# Patient Record
Sex: Male | Born: 1940
Health system: Southern US, Community
[De-identification: ages and names within clinical notes are randomized; demographics above are authoritative.]

## PROBLEM LIST (undated history)

## (undated) DIAGNOSIS — K5730 Diverticulosis of large intestine without perforation or abscess without bleeding: Secondary | ICD-10-CM

## (undated) DIAGNOSIS — Z9289 Personal history of other medical treatment: Secondary | ICD-10-CM

## (undated) DIAGNOSIS — D126 Benign neoplasm of colon, unspecified: Secondary | ICD-10-CM

## (undated) DIAGNOSIS — I4892 Unspecified atrial flutter: Secondary | ICD-10-CM

## (undated) DIAGNOSIS — Z950 Presence of cardiac pacemaker: Secondary | ICD-10-CM

## (undated) DIAGNOSIS — I2510 Atherosclerotic heart disease of native coronary artery without angina pectoris: Secondary | ICD-10-CM

## (undated) DIAGNOSIS — I639 Cerebral infarction, unspecified: Secondary | ICD-10-CM

## (undated) DIAGNOSIS — J45909 Unspecified asthma, uncomplicated: Secondary | ICD-10-CM

## (undated) HISTORY — DX: Unspecified asthma, uncomplicated: J45.909

## (undated) HISTORY — DX: Diverticulosis of large intestine without perforation or abscess without bleeding: K57.30

## (undated) HISTORY — DX: Personal history of other medical treatment: Z92.89

## (undated) HISTORY — DX: Benign neoplasm of colon, unspecified: D12.6

## (undated) HISTORY — PX: INGUINAL HERNIA REPAIR: SUR1180

## (undated) HISTORY — DX: Atherosclerotic heart disease of native coronary artery without angina pectoris: I25.10

## (undated) HISTORY — DX: Unspecified atrial flutter: I48.92

## (undated) HISTORY — DX: Cerebral infarction, unspecified: I63.9

---

## 1999-10-25 ENCOUNTER — Encounter: Admission: RE | Admit: 1999-10-25 | Discharge: 1999-10-25 | Payer: Self-pay | Admitting: Surgery

## 1999-10-25 ENCOUNTER — Encounter: Payer: Self-pay | Admitting: Surgery

## 2000-02-29 ENCOUNTER — Encounter: Payer: Self-pay | Admitting: Emergency Medicine

## 2000-02-29 ENCOUNTER — Inpatient Hospital Stay (HOSPITAL_COMMUNITY): Admission: EM | Admit: 2000-02-29 | Discharge: 2000-03-02 | Payer: Self-pay | Admitting: Emergency Medicine

## 2000-03-01 ENCOUNTER — Encounter: Payer: Self-pay | Admitting: Gastroenterology

## 2000-03-07 ENCOUNTER — Encounter: Admission: RE | Admit: 2000-03-07 | Discharge: 2000-03-07 | Payer: Self-pay

## 2002-06-10 ENCOUNTER — Ambulatory Visit (HOSPITAL_COMMUNITY): Admission: RE | Admit: 2002-06-10 | Discharge: 2002-06-10 | Payer: Self-pay | Admitting: Cardiology

## 2006-01-20 ENCOUNTER — Encounter (INDEPENDENT_AMBULATORY_CARE_PROVIDER_SITE_OTHER): Payer: Self-pay | Admitting: Cardiology

## 2006-01-20 ENCOUNTER — Ambulatory Visit (HOSPITAL_COMMUNITY): Admission: RE | Admit: 2006-01-20 | Discharge: 2006-01-20 | Payer: Self-pay | Admitting: Cardiology

## 2006-06-02 ENCOUNTER — Ambulatory Visit (HOSPITAL_BASED_OUTPATIENT_CLINIC_OR_DEPARTMENT_OTHER): Admission: RE | Admit: 2006-06-02 | Discharge: 2006-06-02 | Payer: Self-pay | Admitting: Surgery

## 2007-02-05 ENCOUNTER — Ambulatory Visit: Payer: Self-pay | Admitting: Internal Medicine

## 2007-09-09 ENCOUNTER — Encounter: Admission: RE | Admit: 2007-09-09 | Discharge: 2007-09-09 | Payer: Self-pay | Admitting: Gastroenterology

## 2007-09-09 ENCOUNTER — Inpatient Hospital Stay (HOSPITAL_COMMUNITY): Admission: AD | Admit: 2007-09-09 | Discharge: 2007-09-10 | Payer: Self-pay | Admitting: Gastroenterology

## 2007-09-11 ENCOUNTER — Ambulatory Visit (HOSPITAL_COMMUNITY): Admission: RE | Admit: 2007-09-11 | Discharge: 2007-09-11 | Payer: Self-pay | Admitting: Gastroenterology

## 2008-10-01 ENCOUNTER — Emergency Department (HOSPITAL_COMMUNITY): Admission: EM | Admit: 2008-10-01 | Discharge: 2008-10-01 | Payer: Self-pay | Admitting: Emergency Medicine

## 2008-12-05 HISTORY — PX: OTHER SURGICAL HISTORY: SHX169

## 2008-12-24 ENCOUNTER — Encounter: Admission: RE | Admit: 2008-12-24 | Discharge: 2008-12-24 | Payer: Self-pay | Admitting: Gastroenterology

## 2009-08-30 ENCOUNTER — Encounter: Admission: RE | Admit: 2009-08-30 | Discharge: 2009-08-30 | Payer: Self-pay | Admitting: Gastroenterology

## 2010-10-28 ENCOUNTER — Encounter: Payer: Self-pay | Admitting: Gastroenterology

## 2010-12-19 ENCOUNTER — Emergency Department (INDEPENDENT_AMBULATORY_CARE_PROVIDER_SITE_OTHER): Payer: Medicare Other

## 2010-12-19 ENCOUNTER — Emergency Department (HOSPITAL_BASED_OUTPATIENT_CLINIC_OR_DEPARTMENT_OTHER)
Admission: EM | Admit: 2010-12-19 | Discharge: 2010-12-19 | Disposition: A | Discharge status: Home / Self Care | Payer: Medicare Other | Attending: Emergency Medicine | Admitting: Emergency Medicine

## 2010-12-19 DIAGNOSIS — R059 Cough, unspecified: Secondary | ICD-10-CM

## 2010-12-19 DIAGNOSIS — Z79899 Other long term (current) drug therapy: Secondary | ICD-10-CM | POA: Insufficient documentation

## 2010-12-19 DIAGNOSIS — J40 Bronchitis, not specified as acute or chronic: Secondary | ICD-10-CM | POA: Insufficient documentation

## 2010-12-19 DIAGNOSIS — R05 Cough: Secondary | ICD-10-CM

## 2010-12-19 DIAGNOSIS — R509 Fever, unspecified: Secondary | ICD-10-CM

## 2010-12-19 DIAGNOSIS — R0989 Other specified symptoms and signs involving the circulatory and respiratory systems: Secondary | ICD-10-CM

## 2011-02-01 ENCOUNTER — Inpatient Hospital Stay (HOSPITAL_COMMUNITY)
Admission: EM | Admit: 2011-02-01 | Discharge: 2011-02-03 | DRG: 389 | Disposition: A | Discharge status: Home / Self Care | Payer: Medicare Other | Attending: Internal Medicine | Admitting: Internal Medicine

## 2011-02-01 DIAGNOSIS — Z903 Acquired absence of stomach [part of]: Secondary | ICD-10-CM

## 2011-02-01 DIAGNOSIS — K319 Disease of stomach and duodenum, unspecified: Secondary | ICD-10-CM | POA: Diagnosis present

## 2011-02-01 DIAGNOSIS — Z8711 Personal history of peptic ulcer disease: Secondary | ICD-10-CM

## 2011-02-01 DIAGNOSIS — E669 Obesity, unspecified: Secondary | ICD-10-CM | POA: Diagnosis present

## 2011-02-01 DIAGNOSIS — J309 Allergic rhinitis, unspecified: Secondary | ICD-10-CM | POA: Diagnosis present

## 2011-02-01 DIAGNOSIS — Z79899 Other long term (current) drug therapy: Secondary | ICD-10-CM

## 2011-02-01 DIAGNOSIS — K56609 Unspecified intestinal obstruction, unspecified as to partial versus complete obstruction: Principal | ICD-10-CM | POA: Diagnosis present

## 2011-02-01 DIAGNOSIS — I4892 Unspecified atrial flutter: Secondary | ICD-10-CM | POA: Diagnosis present

## 2011-02-02 ENCOUNTER — Inpatient Hospital Stay (HOSPITAL_COMMUNITY): Payer: Medicare Other

## 2011-02-02 ENCOUNTER — Emergency Department (HOSPITAL_COMMUNITY): Payer: Medicare Other

## 2011-02-02 LAB — COMPREHENSIVE METABOLIC PANEL
Albumin: 3.9 g/dL (ref 3.5–5.2)
BUN: 12 mg/dL (ref 6–23)
Calcium: 9.1 mg/dL (ref 8.4–10.5)
Creatinine, Ser: 0.51 mg/dL (ref 0.4–1.5)
GFR calc Af Amer: >60 mL/min (ref >60)
Glucose, Bld: 127 mg/dL — ABNORMAL HIGH (ref 70–99)
Sodium: 137 mEq/L (ref 135–145)
Total Protein: 3.5 g/dL — ABNORMAL LOW (ref 6.0–8.3)

## 2011-02-02 LAB — DIFFERENTIAL
Basophils Absolute: 0 10*3/uL (ref 0.0–0.1)
Basophils Absolute: 0 10*3/uL (ref 0.0–0.1)
Basophils Relative: 0 % (ref 0–1)
Eosinophils Absolute: 0.1 10*3/uL (ref 0.0–0.7)
Eosinophils Relative: 0 % (ref 0–5)
Lymphs Abs: 1.1 10*3/uL (ref 0.7–4.0)
Monocytes Absolute: 0.8 10*3/uL (ref 0.1–1.0)
Monocytes Relative: 6 % (ref 3–12)
Monocytes Relative: 8 % (ref 3–12)
Neutro Abs: 7.9 10*3/uL — ABNORMAL HIGH (ref 1.7–7.7)
Neutrophils Relative %: 80 % — ABNORMAL HIGH (ref 43–77)

## 2011-02-02 LAB — CBC
HCT: 45.8 % (ref 39.0–52.0)
Hemoglobin: 14.7 g/dL (ref 13.0–17.0)
MCH: 32.1 pg (ref 26.0–34.0)
MCHC: 34.4 g/dL (ref 30.0–36.0)
MCV: 91.8 fL (ref 78.0–100.0)
MCV: 93.2 fL (ref 78.0–100.0)
Platelets: 170 10*3/uL (ref 150–400)
RDW: 13.5 % (ref 11.5–15.5)
RDW: 13.6 % (ref 11.5–15.5)
WBC: 11 10*3/uL — ABNORMAL HIGH (ref 4.0–10.5)

## 2011-02-02 LAB — URINALYSIS, ROUTINE W REFLEX MICROSCOPIC
Glucose, UA: NEGATIVE mg/dL
Hgb urine dipstick: NEGATIVE
Nitrite: NEGATIVE

## 2011-02-02 MED ORDER — IOHEXOL 300 MG/ML  SOLN
125.0000 mL | Freq: Once | INTRAMUSCULAR | Status: AC | PRN
  Administered 2011-02-02: 125 mL via INTRAVENOUS

## 2011-02-03 ENCOUNTER — Inpatient Hospital Stay (HOSPITAL_COMMUNITY): Payer: Medicare Other

## 2011-02-03 LAB — CBC
HCT: 41 % (ref 39.0–52.0)
Hemoglobin: 13.8 g/dL (ref 13.0–17.0)
MCH: 31.4 pg (ref 26.0–34.0)
MCHC: 33.7 g/dL (ref 30.0–36.0)
MCV: 93.4 fL (ref 78.0–100.0)

## 2011-02-03 LAB — DIFFERENTIAL
Eosinophils Relative: 4 % (ref 0–5)
Neutro Abs: 4.6 10*3/uL (ref 1.7–7.7)
Neutrophils Relative %: 67 % (ref 43–77)

## 2011-02-03 LAB — COMPREHENSIVE METABOLIC PANEL
ALT: 13 U/L (ref 0–53)
Albumin: 3.1 g/dL — ABNORMAL LOW (ref 3.5–5.2)
BUN: 9 mg/dL (ref 6–23)
CO2: 28 mEq/L (ref 19–32)
Creatinine, Ser: 0.81 mg/dL (ref 0.4–1.5)
GFR calc Af Amer: >60 mL/min (ref >60)
GFR calc non Af Amer: >60 mL/min (ref >60)
Glucose, Bld: 95 mg/dL (ref 70–99)
Sodium: 136 mEq/L (ref 135–145)

## 2011-02-03 NOTE — Consult Note (Signed)
NAME:  Duane Mercado, Duane Mercado NO.:  [id number]  MEDICAL RECORD NO.:  [id number]           PATIENT TYPE:  I  LOCATION:  1540                         FACILITY:  [facility]  PHYSICIAN:  Juanetta Gosling, MDDATE OF BIRTH:  10/31/1940  DATE OF CONSULTATION:  02/01/2011 DATE OF DISCHARGE:                                CONSULTATION   CHIEF COMPLAINT:  Abdominal pain.  HISTORY OF PRESENT ILLNESS:  [name] with less than 24 hours of abdominal bloating and some periumbilical pain.  He did pass a small amount of flatus overnight and did have a bowel movement last night.  He came to the emergency room due to persistence of the pain. He has a history of prior small bowel obstructions that all have resolved conservatively, many of these he thinks have occurred at home and he just waited them out at home.  His last admission was in end of 2008 which was managed nonoperatively.  This has gotten better since he is admitted, he does not really have any significant complaints at this time and has gotten some relief by not eating and IV fluid as well some of the pain medications he was served in the emergency room.  PAST MEDICAL HISTORY: 1. Significant for atrial flutter for which she has had cardioversion     in the the past. 2. Seasonal allergies. 3. History of all multiple small bowel obstructions.  PAST SURGICAL HISTORY: 1. He had a partial gastrectomy for perforated ulcer about 21 years     ago that, I think, was repaired with a Billroth I anastomosis 2. Right inguinal hernia. 3. Left inguinal hernia. 4. Tonsillectomy.  MEDICATIONS:  His medications are as Tambocor and he tells he takes several of his other vitamins.  He has previously been on Coumadin but has been off the Coumadin for about 3 years at this point.  SOCIAL HISTORY:  He works as a Education officer, community.  He has occasional alcohol use. Does not smoke.  ALLERGIES:  None known.  PHYSICAL EXAMINATION:   VITAL SIGNS:  His physical examination today shows him to be temperature 98.5, pulse 54, blood pressure 137/82, respirations 18 and pulse oximetry 95% on room air. GENERAL:  He is a well-appearing male, in no distress. HEENT:  He has no scleral icterus. HEART:  Regular rate and rhythm. LUNGS:  Clear bilaterally. ABDOMEN:  His abdomen has a well-healed midline scar.  His abdomen is fairly soft.  He does have bowel sounds that are present.  He is not really distended at all and he is really only mildly tender around his umbilicus.  LABORATORY DATA:  His laboratory evaluation shows a white blood cell count of 9.8 with 80 segs, platelets of 160, hematocrit 42.7. Urinalysis is negative.  His compressive metabolic profile was significant only for bilirubin of 1.6, total protein of 3.5, and a glucose of 127.  An acute  abdominal series that was completed early on February 02, 2011, shows dilated small bowel loops.  A CT scan shows a partial small bowel obstruction near the umbilicus, diverticulosis and cholelithiasis.  He has some  minimal pelvic ascites.  ASSESSMENT:  Small-bowel obstruction, partial.  PLAN:  I think he most likely has a small-bowel obstruction secondary to adhesive disease.  He is in no need of an urgent operation.  I think we will plan on just following him conservatively, n.p.o., recheck a KUB in the morning to see if his contrast is moving through.  I do not think he needs an NG tube right now at all either but if he becomes nauseated, vomiting or is at all distended, he will need to have an NG tube placed. I discussed this plan with him and he is amenable to this.     Juanetta Gosling, MD     MCW/MEDQ  D:  02/02/2011  T:  02/02/2011  Job:  [id number]  cc:   Danise Edge, M.D. Fax: [phone]  Electronically Signed by Emelia Loron MD on 02/03/2011 08:07:29 PM

## 2011-02-13 NOTE — Discharge Summary (Signed)
NAME:  Duane Mercado, Duane Mercado NO.:  [id number]  MEDICAL RECORD NO.:  [id number]           PATIENT TYPE:  I  LOCATION:  1540                         FACILITY:  [facility]  PHYSICIAN:  Ramiro Harvest, MD    DATE OF BIRTH:  [birth year]  DATE OF ADMISSION:  02/01/2011 DATE OF DISCHARGE:  02/03/2011                        DISCHARGE SUMMARY - REFERRING   PRIMARY CARE PHYSICIAN:  Dr. Danise Edge.  DISCHARGE DIAGNOSES: 1. Small bowel obstruction, resolved. 2. Abdominal pain secondary to problem #1. 3. Atrial flutter. 4. History of peptic ulcer disease. 5. History of recurrent small bowel obstructions secondary to     adhesions. 6. Status post partial gastrectomy secondary to a perforated ulcer     about 21 years ago. 7. Right inguinal hernia repair. 8. Status post left inguinal hernia repair. 9. Status post tonsillectomy. 10.History of seasonal allergies.  DISCHARGE MEDICATIONS: 1. Coenzyme Q10 100 mg 2 tablets p.o. daily. 2. Fish oil 1000 mg p.o. daily. 3. Flecainide 50 mg p.o. b.i.d. 4. Magnesium oxide 500 mg p.o. daily. 5. Multivitamins 1 tablet p.o. daily. 6. Vitamin C 500 mg 2 tablets p.o. daily. 7. Vitamin D3, 1000 units 1 tablet p.o. daily.  DISPOSITION AND FOLLOWUP:  The patient will be discharged home on clear liquids for the next 24 hours and then advance as tolerated to a bland diet.  The patient is to call to schedule a followup appointment with his PCP 1 week post discharge.  CONSULTATIONS DONE:  A surgical consultation was done.  The patient was seen in consultation by Dr. Emelia Loron of [facility] on February 01, 2011.  PROCEDURES PERFORMED: 1. An acute abdominal series was done February 02, 2011 that showed a     small bowel obstruction.  No free intraperitoneal air.  No acute     cardiopulmonary disease. 2. A CT of the abdomen and pelvis was done, February 02, 2011 that showed     partial small bowel obstruction due to an adhesion in the  midabdomen at the approximate level of the umbilicus.  No free     intraperitoneal air, sigmoid colon diverticulosis without evidence     of acute diverticulitis, minimal pelvic ascites, cholelithiasis     without CT evidence of acute cholecystitis, moderate median lobe     prostate gland enlargement.  BRIEF ADMISSION HISTORY AND PHYSICAL:  [name] is a 70 year old Caucasian gentleman who presented to the ED with complaints of severe left-sided abdominal pain, which started in the afternoon on the day of admission.  The patient reported having a history of small bowel obstruction secondary to abdominal surgeries that he had in the past. The patient stated that he tried to relieve the discomfort by not eating.  The abdominal pain continued, so he called the gastroenterologist on call for Dr. Laural Benes, Dr. Bosie Clos who advised him to report to the ED for evaluation.  The patient denied having any nausea or vomiting.  He also reported having 2 bowel movements, one in the morning and one following the discomfort.  The patient stated at times, the discomfort will usually resolve in a few hours,  but this time, did not resolve and thus presented to the ED.  For the rest of admission history and physical, please see H and P dictated per Dr. Lovell Sheehan of job [phone].  HOSPITAL COURSE:  Small bowel obstruction.  The patient was admitted with small bowel obstruction.  The patient was placed on bowel rest and placed on supportive care with antiemetics and pain management.  He was monitored and followed and placed on IV fluids.  A surgical consultation was subsequently obtained.  The patient was seen in consultation by Dr. Emelia Loron of General Surgery and at that time, recommended to continue bowel rest and to treat conservatively.  The patient did not need an NG tube as he remained stable and was not vomiting and the stomach was not distended.  The patient was monitored and followed.   The patient improved clinically.  During the hospitalization, abdominal pain improved significantly.  Did not have any nausea, did not have any emesis.  The patient was passing gas and also had bowel movements as well.  The patient was subsequently placed on a clear liquid diet, which the patient tolerated well.  The patient did not have any further abdominal pain.  The patient will be discharged home on 24 hours of clear liquids and advance as tolerated to a bland liquids and bland diet.  The patient will be discharged home in stable and improved condition with close followup with his PCP as stated above.  On day of discharge, vital signs, temperature 98.3, pulse of 51, respirations 20, blood pressure 127/63, and saturating 94% on room air.  Discharge labs, sodium 136, potassium 3.8, chloride 104, bicarbonate 28, glucose 95, BUN 9, creatinine 0.81, bilirubin of 1.6, alkaline phosphatase 63, AST 14, ALT 13, protein 5.5, albumin 3.1, and calcium of 8.2.  CBC with a white count of 6.9, hemoglobin 13.8, hematocrit 41.0, platelet count of 162, and ANC of 4.6.  It has been a pleasure taking care of [name].     Ramiro Harvest, MD     DT/MEDQ  D:  02/03/2011  T:  02/03/2011  Job:  [id number]  cc:   Danise Edge, M.D. Fax: [phone]  Juanetta Gosling, MD [address] [place] [address]  Electronically Signed by Ramiro Harvest MD on 02/13/2011 02:51:53 PM

## 2011-02-16 NOTE — H&P (Signed)
NAME:  Duane Mercado, TAN NO.:  [id number]  MEDICAL RECORD NO.:  [id number]           PATIENT TYPE:  I  LOCATION:  1540                         [facility]ACILITY:  [facility]  PHYSICIAN:  Della Goo, M.D. DATE OF BIRTH:  May 15, 1941  DATE OF ADMISSION:  02/01/2011 DATE OF DISCHARGE:                             HISTORY & PHYSICAL   PRIMARY CARE PHYSICIAN:  Danise Edge, M.D.  CHIEF COMPLAINT:  Abdominal pain.  HISTORY OF PRESENT ILLNESS:  [name] who was brought to the emergency department with complaints of severe left-sided abdominal pain, which started in the afternoon.  The patient reports having a history of small bowel obstructions secondary to abdominal surgeries that he has had in the past.  He states that he tried to relieve the discomfort by not eating and the abdominal pain continued, so he called the gastroenterologist on-call for Dr. Laural Benes and Dr. Cheryl Flash advised him to report to the emergency department for evaluation.  The patient denied having any nausea or vomiting.  He also reports having 2 bowel movements, 1 in the a.m. and 1 following the discomfort.  He states that at times this discomfort will usually resolve in a few hours, but at this time the discomfort did not resolve.  PAST MEDICAL HISTORY:  Significant for: 1. Partial gastrectomy secondary to a perforated ulcer. 2. Atrial flutter, on flecainide therapy. 3. Peptic ulcer disease. 4. History of recurrent small bowel obstruction secondary to     adhesions.  MEDICATIONS:  Flecainide 50 mg 1 p.o. b.i.d.  ALLERGIES:  ASPIRIN and IBUPROFEN.  SOCIAL HISTORY:  The patient is a Education officer, community in the area.  He is a nonsmoker.  He reports alcohol usage of 3-4 times a week and denies any illicit drug usage.  FAMILY HISTORY:  Positive for father dying of melanoma, but otherwise noncontributory.  REVIEW OF SYSTEMS:  Pertinents mentioned above.  PHYSICAL EXAMINATION:  GENERAL:  This  is a 70 year old obese Caucasian male who is in discomfort, but no acute distress. VITAL SIGNS:  Temperature 97.9, blood pressure 187/92, heart rate 67, respiratory rate 20, O2 saturations 95%. HEENT:  Normocephalic, atraumatic.  Pupils equally round, reactive to light.  Extraocular movements are intact.  Funduscopic benign.  Nares are patent bilaterally.  Oropharynx clear. NECK:  Supple.  Full range of motion.  No thyromegaly, adenopathy or jugular venous distention. CARDIOVASCULAR:  Regular rate and rhythm.  No murmurs, gallops or rubs appreciated. LUNGS:  Clear to auscultation bilaterally.  Normal lung field excursion bilaterally.  Breathing is unlabored. ABDOMEN:  Positive bowel sounds, which are mildly hypoactive.  Abdomen is distended and mildly tender on the left side.  There is no rebound. No guarding. EXTREMITIES:  Without cyanosis, clubbing or edema. NEUROLOGIC:  Nonfocal.  LABORATORY STUDIES:  White blood cell count 11.0, hemoglobin 16.0, hematocrit 45.8, platelets 170, neutrophils 88%, lymphocytes 6%.  Sodium 137, potassium 4.1, chloride 104, CO2 of 24, BUN 12, creatinine 0.51, glucose 127.  Albumin 3.9, AST 22, ALT 16.  Urinalysis is negative. Acute abdominal series revealed clear lung fields and evidence of a small bowel obstruction.  No free air seen.  ASSESSMENT:  [name] being admitted with: 1. Small bowel obstruction. 2. Abdominal pain on the left side secondary to small bowel     obstruction. 3. Atrial flutter, on flecainide therapy and stable at this time. 4. Peptic ulcer disease. 5. Obesity.  PLAN:  The patient will be admitted.  He will be made n.p.o. except for ice chips at this time.  Pain control therapy has been ordered as needed and antiemetic therapy has also been ordered as well.  His regular medications will be further verified and the patient will be placed on DVT prophylaxis.  Further consultations will be considered by  the primary team if needed.  General surgery and/or gastroenterology.  The patient is a full code.     Della Goo, M.D.     HJ/MEDQ  D:  02/02/2011  T:  02/02/2011  Job:  [id number]  cc:   Danise Edge, M.D. Fax: [phone]  Electronically Signed by Della Goo M.D. on 02/16/2011 07:47:31 PM

## 2011-02-19 NOTE — Consult Note (Signed)
NAME:  Duane Mercado, COOR                 ACCOUNT NO.:  [id number]   MEDICAL RECORD NO.:  [id number]          PATIENT TYPE:  INP   LOCATION:  5703                         FACILITY:  [facility]   PHYSICIAN:  Dorisann Frames, M.D.   DATE OF BIRTH:  [birth year]   DATE OF CONSULTATION:  DATE OF DISCHARGE:                                 CONSULTATION   REFERRING PHYSICIAN:  Dr. Laural Benes.   CARDIOLOGIST:  Dr. Amil Amen.   CONSULTING SURGEON:  Dr. Talmage Nap.   REASON FOR CONSULT:  Partial small bowel obstruction.   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 Laural Benes with a  partial small bowel obstruction.  He has a history of atrial flutter, as  well as asthma.  The patient states that he had a partial gastrectomy  done about 20 years ago, and since then he has had several episodes of  partial small bowel obstructions which have resolved on their own,  except for 1 episode in 2001 which he says he was admitted to [facility] for;  however, I do not see any record of that admission.  He has also had  right- and left-sided inguinal hernia repair.  One was done in 1997, and  the other was done in 2007 by Dr. Daphine Deutscher.   The patient states that last night he ate a lot of roughage, and when he  woke up around 2 a.m. this morning, he was complaining of pain that he  rates a 7 or 8 on a scale out of 10.  At this time, he tried to go to  the restroom and had a large bowel movement.  However, he feels that  this was just residual stool that was in his colon.  He also self-  induced vomiting because he felt full.   At this time, he is not, and has never been, nauseated.  He denies  having any blood in his stool or his emesis.  Currently, the patient is  not having much pain.  He did have abdominal x-rays done today at  [facility] which were ordered by Dr. Laural Benes, which show a  partial small bowel obstruction.  At this time, we are  consulted to help  manage this issue.   REVIEW OF SYSTEMS:  Negative.  See HPI.   PAST MEDICAL HISTORY:  1. Atrial flutter, which he has had cardioversion for in the past.  2. Asthma, which they attribute to seasonal rhinitis.  3. Partial small bowel obstructions over the past 20 years on and off.      A partial small bowel obstruction in 2001 required admission;      however, apparently the abdominal CT scan at that time was okay.   PAST SURGICAL HISTORY:  1. He had a partial gastrectomy for a perforated ulcer about 20 years      ago.  2. He also had a right inguinal hernia repair in 1997.  3. He had a tonsillectomy (I am unaware of this date).  4. He  also had a left inguinal hernia repair done in 2007 by Dr.      Daphine Deutscher.   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  He states  that he has never used tobacco products.  He does drink in moderation  some wine but does not drink any hard liquor and denies any use of  illegal drugs.   ALLERGIES:  NO KNOWN DRUG ALLERGIES.   MEDICATIONS:  1. Coumadin 5 mg Tuesday through Sunday and 2.5 mg on Monday.  2. Tambocor 50 mg b.i.d.  3. He also takes other various vitamins (vitamin C, D, and E), as well      as calcium and omega-3/glucosamine.  4. Since being admitted, he has been ordered IV Dilaudid p.r.n. for      pain, as well as Ambien and Protonix.   PHYSICAL EXAMINATION:  GENERAL:  This is a pleasant, well-developed,  well-nourished 70 year old white male who is in no acute distress lying  in bed.  VITAL SIGNS:  Temperature is 97.5, blood pressure is 136/82, and weight  is 238 pounds.  HEENT:  Head is normocephalic, atraumatic.  The sclerae are noninjected.  NECK:  Supple, with no lymphadenopathy, no thyromegaly.  Trachea is  midline.  CHEST:  Clear to auscultation bilaterally.  No wheezes, rhonchi, or  rales heard.  HEART:  Regular rate and rhythm, with no murmurs, gallops, or rubs  noted.  No obvious JVD.   ABDOMEN:  Soft and tender in the left periumbilical region.  Otherwise,  he is nontender.  He does have positive bowel sounds and is slightly  distended.  A small mass is noted in the left periumbilical region,  which he states has been there for a long time and he thinks is a  lipoma.  No organomegaly.  EXTREMITIES:  He is moving all 4 extremities.  Pulses are palpable in  all 4 extremities.  No cyanosis, clubbing, or edema noted.  NEUROLOGIC:  He is alert and oriented x 3, moving all 4 extremities,  with no focal deficits.   LABS AND DIAGNOSTICS:  White blood cell count is 8700, hemoglobin is  15.2, and neutrophils are 80%.  Abdominal x-ray done today shows a  partial small bowel obstruction, with no free air.  Otherwise, they are  pending at this time.   IMPRESSION:  1. Partial small bowel obstruction.  2. Atrial flutter, which is controlled with medication.   PLAN:  At this time, we agree with IV hydration via IV fluids.  We also  agree with keeping him n.p.o. at this time, and we will follow any  additional labs.  We will also try and avoid having to use a nasogastric  tube.  At this time, the patient is not having any nausea or vomiting,  and so this would allow [place] to keep the NG tube from being used.  However, if the patient becomes nauseated and begins to throw up and his  belly becomes distended, we may not be able to avoid this, and we will  probably have to use an NG tube.  Hopefully, this can be avoided.  In  the morning, we will also get 2-view abdominal x-rays to follow the  progression of his obstruction.  At this time, we do not feel like  surgical intervention is necessary, and we will continue with medical  management at this time.  Any further recommendations will be per Dr.  Talmage Nap after his examination.  Letha Cape, PA    ______________________________  Dorisann Frames, M.D.   KEO/MEDQ  D:  09/09/2007  T:  09/10/2007  Job:  [id number]

## 2011-02-19 NOTE — H&P (Signed)
NAME:  Duane Mercado, Duane Mercado NO.:  [id number]   MEDICAL RECORD NO.:  [id number]          PATIENT TYPE:  INP   LOCATION:  5703                         FACILITY:  [facility]   PHYSICIAN:  Danise Edge, M.D.   DATE OF BIRTH:  Aug 15, 1941   DATE OF ADMISSION:  09/09/2007  DATE OF DISCHARGE:                              HISTORY & PHYSICAL   ADMISSION DIAGNOSIS:  Small bowel obstruction.   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  He had a normal soft bowel movement without bleeding at 3:00 a.m. today.  He induced nonbloody liquid vomitus early this morning.   In 1991 he underwent a partial gastrectomy, performed for a perforated  gastric ulcer.   In 1997 he underwent a right inguinal hernia repair.  In 2007 he  underwent a left inguinal hernia repair.   In 2001 Dr. Stoney Bang was hospitalized with a partial small bowel  obstruction probably secondary to adhesions which resolved  spontaneously.  A CT scan of the abdomen was normal.   Dr. Jenetta Loges acute abdominal x-ray series performed at [facility]  Imaging this morning was compatible with a partial small-bowel  obstruction.   MEDICATIONS ALLERGIES:  None.   CHRONIC MEDICATIONS:  Coumadin 5 mg on Tuesday through Sunday; 2.5 mg on  Monday. Flecainide 50 mg twice daily.   PAST MEDICAL AND SURGICAL HISTORY:  Atrial flutter requiring  cardioversion.  Bilateral inguinal hernia repairs. Asthma with seasonal  rhinitis. Partial gastrectomy for perforated ulcer in 1991.  Tonsillectomy. Hospitalization in 2001 for small-bowel obstruction,  which resolved spontaneously.   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  He consumes alcohol  in moderation.   FAMILY HISTORY:  Father died of melanoma.  Family history is otherwise  noncontributory.   PHYSICAL EXAMINATION:  VITAL SIGNS:  Weight 238 pounds, temperature  97.5,  blood pressure 136/82.  GENERAL APPEARANCE:  Dr. Stoney Bang is alert and ambulatory.  On admission  his abdomen was moderately distended and slightly uncomfortable with  palpation.  HEENT  Nonicteric sclera.  Normal oropharynx.  No cervical adenopathy.  LUNGS:  Clear to auscultation.  CARDIAC:  Exam reveals a regular rhythm without murmurs.  ABDOMEN:  Moderately distended and hyperresonant to percussion.  Hypoactive bowel sounds were high-pitched and tinkling.  There is  discomfort to deep palpation in the right and left periumbilical areas.  SKIN:  Warm and dry.   ASSESSMENT:  Recurrent partial small-bowel obstruction.   PLAN:  Keep NPO, IV fluids.  I think I can avoid placing a nasogastric  tube.  Consult Dr. Wenda Low.  I did speak with Dr. Amil Amen, and he  says it is okay to remain off flecainide and Coumadin for now.           ______________________________  Danise Edge, M.D.     MJ/MEDQ  D:  09/09/2007  T:  09/10/2007  Job:  [id number]   cc:   Francisca December, M.D.  [place] Daphine Deutscher, MD  Casimiro Needle  Denice Paradise, MD, FCCP

## 2011-02-22 NOTE — Assessment & Plan Note (Signed)
[facility]                             PULMONARY OFFICE NOTE   ROXY, FILLER                          MRN:          [id number]  DATE:02/05/2007                            DOB:          November 09, 1940    HISTORY:  This is a 70 year old white male never smoker who was  diagnosed with asthma by Dr. Andria Meuse several years ago after he fell  off of his boat and swallowed some water. The patient was given a short  course of prednisone and then a prolonged course of Advair.   Once he stopped the Advair, he had no symptoms and has been off of it  for several years with no difficulty. He does have occasional nasal  congestion in the spring, greater than the fall, mostly consistent with  stuffy nose, but minimal runny eyes and nose, for which he uses Zyrtec  with benefit. He is able to work out at [employer] without any  difficulty and denies any subjective or dyspnea, although he admits that  he is not intensely aerobically active.   PAST MEDICAL HISTORY:  Significant for atrial fibrillation for which he  has undergone defibrillation in the past and partial gastrectomy.   ALLERGIES:  None known.   MEDICATIONS:  1. Coumadin.  2. Tambocor.   SOCIAL HISTORY:  He has never smoked. He is an active dentist.   FAMILY HISTORY:  Positive for possible asthma in his son.   REVIEW OF SYSTEMS:  Taken in detail on the worksheet. Significant for  the absence of chronic cough or any significant limitations in terms of  activity.   PHYSICAL EXAMINATION:  GENERAL:  This is a pleasant, ambulatory, and  healthy appearing white male in no acute distress.  VITAL SIGNS:  Stable.  HEENT:  Reveals mild turbinate edema with non-specific features. Oral  pharynx is clear.  NECK:  Supple without cervical adenopathy or tenderness. Trachea is  midline.  LUNGS:  Fields are perfectly bilaterally to auscultation and percussion.  CARDIAC:  Regular rate and rhythm without murmur,  gallop, or rub.  ABDOMEN:  Benign and soft.  EXTREMITIES:  Warm without calf tenderness, cyanosis, clubbing, or  edema.   IMPRESSION:  The only respiratory problem I see is that the patient  appears to have seasonal rhinitis with features that are well controlled  with p.r.n. Zyrtec. Certainly he can use nasal saline as well and we  could use nasal steroids if his symptoms become more severe.   The main issue here is that the punishment needs to fit the crime in  terms of fitting his symptomatology, which is minimum, against any  medicine that he needs to take. For now, I see no need for even p.r.n.  medications, but would like to document a set of PFTs for future  reference.     Charlaine Dalton. Sherene Sires, MD, [facility]  Electronically Signed    MBW/MedQ  DD: 02/05/2007  DT: 02/06/2007  Job #: [id number]   cc:   Danise Edge, M.D.

## 2011-02-22 NOTE — Op Note (Signed)
NAME:  LOTTIE, SISKA NO.:  [id number]   MEDICAL RECORD NO.:  [id number]          PATIENT TYPE:  AMB   LOCATION:  ENDO                         FACILITY:  [facility]   PHYSICIAN:  Francisca December, M.D.  DATE OF BIRTH:  1941-08-24   DATE OF PROCEDURE:  01/20/2006  DATE OF DISCHARGE:                                 OPERATIVE REPORT   PROCEDURE PERFORMED:  1.  Direct current elective cardioversion.  2.  Above following a clearing TEE.   INDICATIONS:  Dr. Assunta Found is 70 year old man with recurrent atrial  flutter.  He has been minimally symptomatic with this.  He has been  anticoagulated for about the past 5 days and has been placed onto p.o.  Flecainide. He is to undergo a direct current cardioversion at this time. I  have completed a TEE which has documented the absence of left atrial  appendage thrombus.   DESCRIPTION OF PROCEDURE:  The patient underwent direct current  cardioversion while monitoring heart rate, blood pressure, O2 saturation and  ECG in the presence of Dr. Adonis Huguenin who administered 200 mg of IV  Pentothal.  After establishing deep anesthesia, the patient received a  single transthoracic dose of 100 joules synchronized biphasic energy.  There  was prompt return of sinus rhythm.   IMPRESSION:  Successful elective cardioversion atrial flutter to sinus  rhythm.   PLAN:  1.  Continue warfarin and Flecainide. Follow-up office visit in 1-2 weeks.  2.  Of note, there was no significant structural heart disease, except mild      left atrial enlargement on his TEE.      Francisca December, M.D.  Electronically Signed     JHE/MEDQ  D:  01/20/2006  T:  01/20/2006  Job:  [id number]

## 2011-02-22 NOTE — Op Note (Signed)
NAME:  Duane Mercado, Duane Mercado                 ACCOUNT NO.:  [id number]   MEDICAL RECORD NO.:  [id number]          PATIENT TYPE:  AMB   LOCATION:  DSC                          FACILITY:  [facility]   PHYSICIAN:  Thornton Park. Daphine Deutscher, MD  DATE OF BIRTH:  02/16/1941   DATE OF PROCEDURE:  06/02/2006  DATE OF DISCHARGE:                                 OPERATIVE REPORT   PREOPERATIVE DIAGNOSIS:  Left inguinal hernia.   POSTOPERATIVE DIAGNOSIS:  Left indirect inguinal hernia.   PROCEDURE:  Left inguinal herniorrhaphy with mesh.   SURGEONS:  Thornton Park. Daphine Deutscher, MD   ANESTHESIA:  General by LMA.   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  The inguinal  region was clipped and then prepped with Betadine widely and draped  sterilely.  A small oblique incision was made and carried down through fatty  tissue of the external oblique, which was identified.  The external oblique  muscles were then divided along the fibers of the external ring.  The cord  was mobilized and elevated and the internal ring was noted.  I found a very  prominent sac, which I easily dissected free from the cord structures and  opened it.  With my finger inside, I found some fatty tissue herniating down  into the canal, but I tucked that back up inside and then closed it with a  pursestring suture of 2-0 silk under direct vision and excised the excess  sac.  With this tucked up inside, I then put 2 sutures to tack the internal  ring placed medially and then I repaired the entire floor and the ring with  a piece of mesh that I cut to fit and sutured along the inguinal ligament  with a running 2-0 Prolene below and above.  I did it and then did not like  and then redid it and got a good overage of this floor with a running 2-0  Prolene.  The 2 pieces were overlapped at the exit of the cord structures  and held in place with a single horizontal  mattress suture of 2-0 Prolene.  This was then tucked beneath the external oblique.  The ilioinguinal nerve  branches were not obvious, but I did not appear to incorporate these in any  sutures and the external oblique was closed with a 2-0 Vicryl and the area  was irrigated well and closed with subcutaneous sutures of 4-0 Vicryl in a  running subcuticular 5-0 Vicryl with Benzoin and Steri-Strips.  The patient  seemed to tolerate the procedure well and was taken to the recovery room in  satisfactory condition.  He will be given some Tylox to take if needed for  pain and will followed up in the office in 2-3 weeks.      Thornton Park Daphine Deutscher, MD  Electronically Signed     MBM/MEDQ  D:  06/02/2006  T:  06/03/2006  Job:  [id number]

## 2011-02-22 NOTE — Op Note (Signed)
   NAME:  MARKEEM, NOREEN NO.:  [id number]   MEDICAL RECORD NO.:  [id number]                   PATIENT TYPE:  OIB   LOCATION:  2870                                 FACILITY:  [facility]   PHYSICIAN:  Francisca December, M.D.               DATE OF BIRTH:  10/11/1940   DATE OF PROCEDURE:  06/10/2002  DATE OF DISCHARGE:                                 OPERATIVE REPORT   PROCEDURE PERFORMED:  Elective cardioversion   INDICATIONS FOR PROCEDURE:  The patient is a 70 year old man with the onset  of atrial flutter and 4:1 block noted during examination last week.  He has  been complaining of worsening fatigue in reduced exertional times.  He has  been placed on Warfarin for the past three days.  His INR today is 1.2 with  prothrombin time of 15.2.  He has a structurally normal heart by  transthoracic echocardiogram.  A transesophageal echocardiogram has  confirmed the absence of left atrial or left atrial appendage thrombus.  He  has normal left ventricular systolic function and normal valvular  morphology.  No significant Doppler abnormality.  He has a left atrial  appendage ejection velocity of 60 cm per second.  He is to undergo  transthoracic cardioversion at this time.   DESCRIPTION OF PROCEDURE:  Under the supervision of  Dr. Fayrene Fearing D. Krista Blue,  anesthesiology, the patient was administered 200 mg of IV Pentothal.  He had  already received 6 mg of midazolam and 100 mcg of Fentanyl for his TEE.  While monitoring heart rate, blood pressure, O2 saturation and ECG and after  establishing deep anesthesia, the patient underwent a transthoracic dose of  100 joules synchronized direct current energy.  This resulted in prompt  return of sinus rhythm with frequent PACS.   Will administer 10 mg of IV metoprolol for suppression of PACS at this time  and begin p.o. Toprol 25 mg p.o. once daily.   PLAN:  1. Begin beta blocker as above.  2. Will give subcutaneous dose of  Lovenox now 1 mg per kg.  3. Increase Coumadin to 10 mg p.o. daily for the next two days and then     return to 5 mg daily.  4. Repeat prothrombin time in three to four days.  5. I will see him in the office in two to three weeks.                                               Francisca December, M.D.    JHE/MEDQ  D:  06/10/2002  T:  06/11/2002  Job:  [id number]   cc:   Gracelyn Nurse, M.D.

## 2011-02-22 NOTE — Discharge Summary (Signed)
NAME:  Duane Mercado, Duane Mercado NO.:  [id number]   MEDICAL RECORD NO.:  [id number]          PATIENT TYPE:  INP   LOCATION:  5703                         FACILITY:  [facility]   PHYSICIAN:  Danise Edge, M.D.   DATE OF BIRTH:  [birth year]   DATE OF ADMISSION:  09/09/2007  DATE OF DISCHARGE:  09/10/2007                               DISCHARGE SUMMARY   DISCHARGE DIAGNOSES:  1. Partial small-bowel obstruction, resolved.  2. Remote history of atrial flutter, cardioverted.  3. Chronic Coumadin therapy.  4. Left inguinal herniorrhaphy.  5. Right inguinal herniorrhaphy.  6. Asthma with seasonal rhinitis.  7. Remote Billroth I gastrectomy for perforated ulcer.  8. Tonsillectomy.  9. In 2001 CT, scan of the abdomen and pelvis was consistent with      small bowel obstruction secondary to adhesions.   DISCHARGE MEDICATIONS:  1. Coumadin 5 mg on Tuesday through Sunday; 2.5 mg on Monday.  2. Flecainide 50 mg twice daily.   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  Prior to being admitted to the hospital, he had a normal  soft bowel movement unassociated with bleeding.  He induced nonbloody  liquid vomitus prior to admission.   Admission acute abdominal x-ray series performed at [facility]  was compatible with partial small-bowel obstruction.   On admission Dr. Jenetta Loges white blood cell count was 8700, hemoglobin  15.2 g, with a normal platelet count.  His prothrombin time/INR was 1.7  on Coumadin.  His complete metabolic profile was normal.  His vitamin  B12 level was normal.   Dr. Stoney Bang was admitted n.p.o. on intravenous fluids.  A nasogastric  tube was not placed.  Within 24 hours his abdominal distention and  abdominal pain resolved.  His discharge acute abdominal x-ray series was  consistent with an improving partial small bowel obstruction pattern.   Dr. Stoney Bang was discharged in stable medical condition on September 10, 2007.  He underwent a CT enterography as an outpatient, which did not  show any mechanical small-bowel obstruction or small bowel disease.          ______________________________  Danise Edge, M.D.    MJ/MEDQ  D:  10/12/2007  T:  10/12/2007  Job:  [id number]

## 2011-07-15 LAB — CBC
Hemoglobin: 15.2
RBC: 4.78
RDW: 13.3
WBC: 8.7

## 2011-07-15 LAB — COMPREHENSIVE METABOLIC PANEL
ALT: 21
AST: 28
Alkaline Phosphatase: 72
CO2: 26
Calcium: 9.2
GFR calc Af Amer: >60
GFR calc non Af Amer: >60
Potassium: 4.5
Sodium: 139

## 2011-07-15 LAB — DIFFERENTIAL
Basophils Relative: 0
Eosinophils Absolute: 0 — ABNORMAL LOW
Eosinophils Relative: 0
Lymphs Abs: 1.1
Monocytes Relative: 7

## 2011-07-15 LAB — AMYLASE: Amylase: 37

## 2011-10-10 DIAGNOSIS — J111 Influenza due to unidentified influenza virus with other respiratory manifestations: Secondary | ICD-10-CM | POA: Diagnosis not present

## 2011-10-10 DIAGNOSIS — R030 Elevated blood-pressure reading, without diagnosis of hypertension: Secondary | ICD-10-CM | POA: Diagnosis not present

## 2012-10-23 DIAGNOSIS — I495 Sick sinus syndrome: Secondary | ICD-10-CM | POA: Diagnosis not present

## 2012-10-23 DIAGNOSIS — I4891 Unspecified atrial fibrillation: Secondary | ICD-10-CM | POA: Diagnosis not present

## 2012-10-23 DIAGNOSIS — I4892 Unspecified atrial flutter: Secondary | ICD-10-CM | POA: Diagnosis not present

## 2012-10-23 DIAGNOSIS — I441 Atrioventricular block, second degree: Secondary | ICD-10-CM | POA: Diagnosis not present

## 2012-10-29 DIAGNOSIS — I4892 Unspecified atrial flutter: Secondary | ICD-10-CM | POA: Diagnosis not present

## 2012-10-29 DIAGNOSIS — I4891 Unspecified atrial fibrillation: Secondary | ICD-10-CM | POA: Diagnosis not present

## 2012-10-29 DIAGNOSIS — I495 Sick sinus syndrome: Secondary | ICD-10-CM | POA: Diagnosis not present

## 2012-10-29 DIAGNOSIS — I441 Atrioventricular block, second degree: Secondary | ICD-10-CM | POA: Diagnosis not present

## 2012-11-24 DIAGNOSIS — I441 Atrioventricular block, second degree: Secondary | ICD-10-CM | POA: Diagnosis not present

## 2012-11-24 DIAGNOSIS — I4891 Unspecified atrial fibrillation: Secondary | ICD-10-CM | POA: Diagnosis not present

## 2012-11-24 DIAGNOSIS — R0609 Other forms of dyspnea: Secondary | ICD-10-CM | POA: Diagnosis not present

## 2012-11-24 DIAGNOSIS — R0989 Other specified symptoms and signs involving the circulatory and respiratory systems: Secondary | ICD-10-CM | POA: Diagnosis not present

## 2013-04-15 DIAGNOSIS — I441 Atrioventricular block, second degree: Secondary | ICD-10-CM | POA: Diagnosis not present

## 2013-04-15 DIAGNOSIS — I495 Sick sinus syndrome: Secondary | ICD-10-CM | POA: Diagnosis not present

## 2013-04-15 DIAGNOSIS — I2510 Atherosclerotic heart disease of native coronary artery without angina pectoris: Secondary | ICD-10-CM | POA: Diagnosis not present

## 2013-04-15 DIAGNOSIS — I4891 Unspecified atrial fibrillation: Secondary | ICD-10-CM | POA: Diagnosis not present

## 2013-04-22 DIAGNOSIS — G4733 Obstructive sleep apnea (adult) (pediatric): Secondary | ICD-10-CM | POA: Diagnosis not present

## 2013-08-13 ENCOUNTER — Encounter: Payer: Medicare Other | Admitting: Physician Assistant

## 2013-10-04 ENCOUNTER — Encounter: Payer: Self-pay | Admitting: General Surgery

## 2013-10-04 DIAGNOSIS — I2510 Atherosclerotic heart disease of native coronary artery without angina pectoris: Secondary | ICD-10-CM

## 2013-10-04 DIAGNOSIS — I495 Sick sinus syndrome: Secondary | ICD-10-CM | POA: Insufficient documentation

## 2013-10-04 DIAGNOSIS — I4892 Unspecified atrial flutter: Secondary | ICD-10-CM | POA: Insufficient documentation

## 2013-10-04 DIAGNOSIS — I4891 Unspecified atrial fibrillation: Secondary | ICD-10-CM

## 2013-10-04 DIAGNOSIS — I441 Atrioventricular block, second degree: Secondary | ICD-10-CM

## 2013-10-14 ENCOUNTER — Ambulatory Visit: Payer: Medicare Other | Admitting: Cardiology

## 2013-10-14 ENCOUNTER — Ambulatory Visit (INDEPENDENT_AMBULATORY_CARE_PROVIDER_SITE_OTHER): Payer: Medicare Other | Admitting: Cardiology

## 2013-10-14 ENCOUNTER — Encounter: Payer: Self-pay | Admitting: Cardiology

## 2013-10-14 VITALS — BP 139/83 | HR 72 | Ht 77.0 in | Wt 236.0 lb

## 2013-10-14 DIAGNOSIS — I4892 Unspecified atrial flutter: Secondary | ICD-10-CM

## 2013-10-14 DIAGNOSIS — I2510 Atherosclerotic heart disease of native coronary artery without angina pectoris: Secondary | ICD-10-CM

## 2013-10-14 DIAGNOSIS — I495 Sick sinus syndrome: Secondary | ICD-10-CM | POA: Diagnosis not present

## 2013-10-14 DIAGNOSIS — I441 Atrioventricular block, second degree: Secondary | ICD-10-CM

## 2013-10-14 NOTE — Patient Instructions (Signed)
Your physician recommends that you continue on your current medications as directed. Please refer to the Current Medication list given to you today.  Your physician wants you to follow-up in: 6 Months with Dr Turner You will receive a reminder letter in the mail two months in advance. If you don't receive a letter, please call our office to schedule the follow-up appointment.  

## 2013-10-14 NOTE — Progress Notes (Signed)
  [place], [place], [place]  [id number] Phone: [phone] Fax:  [phone]  Date:  10/14/2013   ID:  Duane Mercado, DOB 05-Sep-1941, MRN [id number]  PCP:  Duane Fair, MD  Cardiologist:  Duane Him, MD   History of Present Illness: Duane Mercado is a 73 y.o. male with a history of paroxysmal atrial flutter, OSA and coronary artery calcifications on chest CT with no ischemia on stress test who presents back today for followup.  He is doing well.  He denies any chest pain, SOB, DOE, LE edema, dizziness, palpitations or syncope.  He did not want to use CPAP.   Wt Readings from Last 3 Encounters:  10/04/13 229 lb 9.6 oz (104.146 kg)     Past Medical History  Diagnosis Date  . Asthma     w seasonal rhinitis  . Sigmoid diverticulosis   . Hx of cardiovascular stress test     coronary artery calcifications with normal nuclear study in 2010  . Tubulovillous adenoma polyp of colon   . Coronary artery calcification seen on CAT scan     nomal nuclear stress test  . Paroxysmal atrial flutter     Current Outpatient Prescriptions  Medication Sig Dispense Refill  . aspirin 81 MG tablet Take 81 mg by mouth daily.       No current facility-administered medications for this visit.    Allergies:   Allergies no known allergies  Social History:  The patient  reports that he has never smoked. He does not have any smokeless tobacco history on file. He reports that he drinks alcohol. He reports that he does not use illicit drugs.   Family History:  The patient's family history is not on file.   ROS:  Please see the history of present illness.      All other systems reviewed and negative.   PHYSICAL EXAM: VS:  There were no vitals taken for this visit. Well nourished, well developed, in no acute distress HEENT: normal Neck: no JVD Cardiac:  normal S1, S2; RRR; no murmur Lungs:  clear to auscultation bilaterally, no wheezing, rhonchi or rales Abd: soft, nontender, no  hepatomegaly Ext: no edema Skin: warm and dry Neuro:  CNs 2-12 intact, no focal abnormalities noted  EKG:     Sinus bradycardia with type I second degree AV block and nonspecific IVCD  ASSESSMENT AND PLAN:  1. Paroxysmal atrial flutter - maintaining NSR  - continue ASA 2.   Coronary artery calcifications with no ischemia on nuclear stress test - continue ASA 3.   Mild OSA - refused CPAP at this time 4.   Wenkebach second degree AV block - asymptomatic  Followup with me in 6 months  Signed, Duane Him, MD 10/14/2013 3:47 PM

## 2015-01-25 DIAGNOSIS — H43813 Vitreous degeneration, bilateral: Secondary | ICD-10-CM | POA: Diagnosis not present

## 2015-01-25 DIAGNOSIS — H2513 Age-related nuclear cataract, bilateral: Secondary | ICD-10-CM | POA: Diagnosis not present

## 2015-07-12 ENCOUNTER — Inpatient Hospital Stay (HOSPITAL_COMMUNITY)
Admission: EM | Admit: 2015-07-12 | Discharge: 2015-07-14 | DRG: 041 | Disposition: A | Discharge status: Home / Self Care | Payer: Medicare Other | Attending: Internal Medicine | Admitting: Internal Medicine

## 2015-07-12 ENCOUNTER — Observation Stay (HOSPITAL_COMMUNITY): Payer: Medicare Other

## 2015-07-12 ENCOUNTER — Emergency Department (HOSPITAL_COMMUNITY): Payer: Medicare Other

## 2015-07-12 ENCOUNTER — Encounter (HOSPITAL_COMMUNITY): Payer: Self-pay | Admitting: Neurology

## 2015-07-12 DIAGNOSIS — G451 Carotid artery syndrome (hemispheric): Secondary | ICD-10-CM

## 2015-07-12 DIAGNOSIS — G8191 Hemiplegia, unspecified affecting right dominant side: Secondary | ICD-10-CM | POA: Diagnosis present

## 2015-07-12 DIAGNOSIS — J45909 Unspecified asthma, uncomplicated: Secondary | ICD-10-CM | POA: Diagnosis present

## 2015-07-12 DIAGNOSIS — Z86718 Personal history of other venous thrombosis and embolism: Secondary | ICD-10-CM

## 2015-07-12 DIAGNOSIS — G459 Transient cerebral ischemic attack, unspecified: Secondary | ICD-10-CM | POA: Diagnosis not present

## 2015-07-12 DIAGNOSIS — I480 Paroxysmal atrial fibrillation: Secondary | ICD-10-CM | POA: Diagnosis present

## 2015-07-12 DIAGNOSIS — I6601 Occlusion and stenosis of right middle cerebral artery: Secondary | ICD-10-CM | POA: Diagnosis not present

## 2015-07-12 DIAGNOSIS — R001 Bradycardia, unspecified: Secondary | ICD-10-CM | POA: Diagnosis present

## 2015-07-12 DIAGNOSIS — M79606 Pain in leg, unspecified: Secondary | ICD-10-CM

## 2015-07-12 DIAGNOSIS — I442 Atrioventricular block, complete: Secondary | ICD-10-CM | POA: Diagnosis not present

## 2015-07-12 DIAGNOSIS — Z950 Presence of cardiac pacemaker: Secondary | ICD-10-CM | POA: Diagnosis not present

## 2015-07-12 DIAGNOSIS — I481 Persistent atrial fibrillation: Secondary | ICD-10-CM | POA: Diagnosis present

## 2015-07-12 DIAGNOSIS — Z79899 Other long term (current) drug therapy: Secondary | ICD-10-CM | POA: Diagnosis not present

## 2015-07-12 DIAGNOSIS — I10 Essential (primary) hypertension: Secondary | ICD-10-CM | POA: Diagnosis present

## 2015-07-12 DIAGNOSIS — M79604 Pain in right leg: Secondary | ICD-10-CM | POA: Diagnosis present

## 2015-07-12 DIAGNOSIS — I499 Cardiac arrhythmia, unspecified: Secondary | ICD-10-CM | POA: Diagnosis not present

## 2015-07-12 DIAGNOSIS — E785 Hyperlipidemia, unspecified: Secondary | ICD-10-CM | POA: Diagnosis present

## 2015-07-12 DIAGNOSIS — Z95818 Presence of other cardiac implants and grafts: Secondary | ICD-10-CM

## 2015-07-12 DIAGNOSIS — R2681 Unsteadiness on feet: Secondary | ICD-10-CM | POA: Diagnosis not present

## 2015-07-12 DIAGNOSIS — Z7982 Long term (current) use of aspirin: Secondary | ICD-10-CM

## 2015-07-12 DIAGNOSIS — I482 Chronic atrial fibrillation: Secondary | ICD-10-CM | POA: Diagnosis not present

## 2015-07-12 DIAGNOSIS — I4891 Unspecified atrial fibrillation: Secondary | ICD-10-CM | POA: Diagnosis not present

## 2015-07-12 DIAGNOSIS — G4733 Obstructive sleep apnea (adult) (pediatric): Secondary | ICD-10-CM | POA: Diagnosis present

## 2015-07-12 DIAGNOSIS — I4892 Unspecified atrial flutter: Secondary | ICD-10-CM | POA: Diagnosis present

## 2015-07-12 DIAGNOSIS — I2510 Atherosclerotic heart disease of native coronary artery without angina pectoris: Secondary | ICD-10-CM | POA: Diagnosis present

## 2015-07-12 DIAGNOSIS — R531 Weakness: Secondary | ICD-10-CM | POA: Diagnosis not present

## 2015-07-12 DIAGNOSIS — I6623 Occlusion and stenosis of bilateral posterior cerebral arteries: Secondary | ICD-10-CM | POA: Diagnosis not present

## 2015-07-12 LAB — APTT: aPTT: 28 seconds (ref 24–37)

## 2015-07-12 LAB — DIFFERENTIAL
Basophils Absolute: 0 10*3/uL (ref 0.0–0.1)
Basophils Relative: 0 %
EOS PCT: 2 %
Eosinophils Absolute: 0.1 10*3/uL (ref 0.0–0.7)
LYMPHS ABS: 1.1 10*3/uL (ref 0.7–4.0)
LYMPHS PCT: 19 %
MONO ABS: 0.4 10*3/uL (ref 0.1–1.0)
MONOS PCT: 7 %
Neutro Abs: 4 10*3/uL (ref 1.7–7.7)
Neutrophils Relative %: 72 %

## 2015-07-12 LAB — PROTIME-INR
INR: 1.15 (ref 0.00–1.49)
PROTHROMBIN TIME: 14.9 s (ref 11.6–15.2)

## 2015-07-12 LAB — COMPREHENSIVE METABOLIC PANEL
ALK PHOS: 70 U/L (ref 38–126)
ALT: 26 U/L (ref 17–63)
ANION GAP: 11 (ref 5–15)
AST: 33 U/L (ref 15–41)
Albumin: 3.9 g/dL (ref 3.5–5.0)
BILIRUBIN TOTAL: 1 mg/dL (ref 0.3–1.2)
BUN: 18 mg/dL (ref 6–20)
CALCIUM: 9 mg/dL (ref 8.9–10.3)
CO2: 21 mmol/L — ABNORMAL LOW (ref 22–32)
CREATININE: 0.84 mg/dL (ref 0.61–1.24)
Chloride: 108 mmol/L (ref 101–111)
Glucose, Bld: 111 mg/dL — ABNORMAL HIGH (ref 65–99)
Potassium: 4 mmol/L (ref 3.5–5.1)
Sodium: 140 mmol/L (ref 135–145)
TOTAL PROTEIN: 6.4 g/dL — AB (ref 6.5–8.1)

## 2015-07-12 LAB — CBC
HEMATOCRIT: 42.5 % (ref 39.0–52.0)
HEMOGLOBIN: 14.7 g/dL (ref 13.0–17.0)
MCH: 31.9 pg (ref 26.0–34.0)
MCHC: 34.6 g/dL (ref 30.0–36.0)
MCV: 92.2 fL (ref 78.0–100.0)
PLATELETS: 151 10*3/uL (ref 150–400)
RBC: 4.61 MIL/uL (ref 4.22–5.81)
RDW: 13.4 % (ref 11.5–15.5)
WBC: 5.6 10*3/uL (ref 4.0–10.5)

## 2015-07-12 LAB — I-STAT TROPONIN, ED: Troponin i, poc: 0.02 ng/mL (ref 0.00–0.08)

## 2015-07-12 LAB — TROPONIN I

## 2015-07-12 MED ORDER — STROKE: EARLY STAGES OF RECOVERY BOOK
Freq: Once | Status: AC
  Administered 2015-07-12: 18:00:00

## 2015-07-12 MED ORDER — ENOXAPARIN SODIUM 40 MG/0.4ML ~~LOC~~ SOLN
40.0000 mg | SUBCUTANEOUS | Status: DC
Start: 2015-07-12 — End: 2015-07-14
  Administered 2015-07-12 – 2015-07-13 (×2): 40 mg via SUBCUTANEOUS
  Filled 2015-07-12 (×2): qty 0.4

## 2015-07-12 MED ORDER — SENNOSIDES-DOCUSATE SODIUM 8.6-50 MG PO TABS: 1.0000 | ORAL_TABLET | Freq: Every evening | ORAL | Status: DC | PRN

## 2015-07-12 MED ORDER — ASPIRIN EC 325 MG PO TBEC
325.0000 mg | DELAYED_RELEASE_TABLET | Freq: Every day | ORAL | Status: DC
  Administered 2015-07-12 – 2015-07-14 (×3): 325 mg via ORAL
  Filled 2015-07-12 (×3): qty 1

## 2015-07-12 NOTE — ED Notes (Signed)
Patient transported to MRI 

## 2015-07-12 NOTE — Progress Notes (Signed)
Patient left for MRA. 

## 2015-07-12 NOTE — ED Notes (Signed)
Patient presents to ed c/o generalized weakness onset last pm. States he was walking in the yard and noticed he wasn't as steady on his feet. States this am after getting up was shaving and felt like he needed to hold on to the wall. States he is normally left handed and feels a little tingling in his right hand and tingling in his feet. Bilateral grips strong and equal. Moves all ext . Smile is systematically.

## 2015-07-12 NOTE — ED Notes (Signed)
Admitting MD at bedside, will transport patient once they are finished

## 2015-07-12 NOTE — ED Notes (Signed)
Attempted report 

## 2015-07-12 NOTE — ED Provider Notes (Signed)
CSN: [id number]     Arrival date & time 07/12/15  0813 History   First MD Initiated Contact with Patient 07/12/15 [phone]     Chief Complaint  Patient presents with  . Weakness     (Consider location/radiation/quality/duration/timing/severity/associated sxs/prior Treatment) Patient is a 74 y.o. male presenting with weakness. The history is provided by the patient.  Weakness This is a new problem. The current episode started yesterday. The problem occurs constantly. The problem has been unchanged. Associated symptoms include numbness and weakness. Pertinent negatives include no abdominal pain, chest pain, coughing, fever, nausea or vomiting. Nothing aggravates the symptoms. He has tried nothing for the symptoms. The treatment provided no relief.   He started having some unsteadiness to his gait around 8 PM last night. He denied any falls but reports his right side felt different. He is naturally left-handed. He shaved this morning and it took him longer than normal. He presents today due to having unsteadiness in his gait and a different feeling of sensation on his right side.    He has a hx of bradycardia and atrial flutter. His cardiologist (Dr. Radford Pax) has cardioverted him previously for his atrial flutter with the last time being five years ago. Does not use anticoagulation but was previously on coumadin. He is now taking ASA 81 mg but has not taken it today. He is on no other medications. Denise any chest pain, SOB, fever, nausea, vomiting, abdominal pain, changes in bowel movements, melena, hematochezia.   Past Medical History  Diagnosis Date  . Asthma     w seasonal rhinitis  . Sigmoid diverticulosis   . Hx of cardiovascular stress test     coronary artery calcifications with normal nuclear study in 2010  . Tubulovillous adenoma polyp of colon   . Coronary artery calcification seen on CAT scan     nomal nuclear stress test  . Paroxysmal atrial flutter [facility])    Past Surgical History   Procedure Laterality Date  . Tubulovillous adenomatous  12/2008    polyps removed colonoscopically   Family History  Problem Relation Age of Onset  . Hypertension Mother   . Hypertension Father    Social History  Substance Use Topics  . Smoking status: Never Smoker   . Smokeless tobacco: None  . Alcohol Use: Yes    Review of Systems  Constitutional: Negative for fever.  Respiratory: Negative for cough and shortness of breath.   Cardiovascular: Negative for chest pain.  Gastrointestinal: Negative for nausea, vomiting, abdominal pain, diarrhea and constipation.  Musculoskeletal: Positive for gait problem.  Neurological: Positive for weakness and numbness.      Allergies  Review of patient's allergies indicates no known allergies.  Home Medications   Prior to Admission medications   Medication Sig Start Date End Date Taking? Authorizing Provider  aspirin 81 MG tablet Take 81 mg by mouth daily.    Historical Provider, MD   BP 159/68 mmHg  Pulse 34  Temp(Src) 98 F (36.7 C) (Oral)  Resp 18  Wt 231 lb 7.7 oz (105 kg)  SpO2 99% Physical Exam  Constitutional: He is oriented to person, place, and time. He appears well-developed and well-nourished.  HENT:  Head: Normocephalic and atraumatic.  Flattened nasolabial fold on right compared to left.   Eyes: EOM are normal. Pupils are equal, round, and reactive to light.  Neck: Normal range of motion. Neck supple.  Cardiovascular: Normal heart sounds, intact distal pulses and normal pulses.  Bradycardia present.   No  murmur heard. Pulmonary/Chest: Effort normal and breath sounds normal. No respiratory distress. He has no wheezes.  Abdominal: He exhibits no distension.  Musculoskeletal: Normal range of motion. He exhibits no edema.  Neurological: He is alert and oriented to person, place, and time. He has normal strength. A sensory deficit is present. No cranial nerve deficit. He displays a negative Romberg sign.  Sensation  is less on right hand and foot compared to left.  Finger to nose slower on right compared to left.   Skin: Skin is warm and dry.  Psychiatric: He has a normal mood and affect.    ED Course  Procedures (including critical care time) Labs Review Labs Reviewed  COMPREHENSIVE METABOLIC PANEL - Abnormal; Notable for the following:    CO2 21 (*)    Glucose, Bld 111 (*)    Total Protein 6.4 (*)    All other components within normal limits  PROTIME-INR  APTT  CBC  DIFFERENTIAL  I-STAT TROPOININ, ED    Imaging Review Ct Head Wo Contrast  07/12/2015   CLINICAL DATA:  Concern for stroke. Unsteady gait. Right-sided weakness and numbness.  EXAM: CT HEAD WITHOUT CONTRAST  TECHNIQUE: Contiguous axial images were obtained from the base of the skull through the vertex without intravenous contrast.  COMPARISON:  None.  FINDINGS: Mild atrophy, typical for age. Negative for hydrocephalus. Mild chronic microvascular ischemic change in the white matter.  Negative for acute infarct.  Negative for hemorrhage or mass.  Normal calvarium. Mucosal edema in the paranasal sinuses. No air-fluid level. Mild vascular calcification  IMPRESSION: Mild chronic microvascular ischemic change. No acute intracranial abnormality.  Mucosal thickening in the paranasal sinuses.   Electronically Signed   By: Franchot Gallo M.D.   On: 07/12/2015 10:40   Mr Brain Wo Contrast (neuro Protocol)  07/12/2015   CLINICAL DATA:  Generalized weakness beginning last night. Unsteady gait. Tingling in the right hand.  EXAM: MRI HEAD WITHOUT CONTRAST  TECHNIQUE: Multiplanar, multiecho pulse sequences of the brain and surrounding structures were obtained without intravenous contrast.  COMPARISON:  Head CT same day  FINDINGS: Diffusion imaging does not show any acute or subacute infarction. The brainstem and cerebellum are normal. The cerebral hemispheres show a few scattered foci of T2 and FLAIR signal within the white matter consistent with minimal  small vessel change. No cortical or large vessel territory infarction. No mass lesion, hemorrhage, hydrocephalus or extra-axial collection. No pituitary mass. There is mucosal thickening of the paranasal sinuses. Major vessels at the base of the brain show flow.  IMPRESSION: No acute intracranial finding. Mild small vessel change of the cerebral hemispheric white matter.  Mucosal inflammation of the paranasal sinuses.   Electronically Signed   By: Nelson Chimes M.D.   On: 07/12/2015 12:12   I have personally reviewed and evaluated these images and lab results as part of my medical decision-making.   EKG Interpretation   Date/Time:  Wednesday July 12 2015 08:26:13 EDT Ventricular Rate:  32 PR Interval:    QRS Duration: 141 QT Interval:  552 QTC Calculation: 403 R Axis:   -85 Text Interpretation:  A-flutter/fibrillation w/ complete AV block RBBB and  LAFB Probable left ventricular hypertrophy Anterior Q waves, possibly due  to LVH Abnormal T, consider ischemia, lateral leads A-flutter/fib with  bradycardia new from previous EKG Confirmed by LITTLE MD, RACHEL ([id number])  on 07/12/2015 10:18:56 AM      MDM   Final diagnoses:  Transient cerebral ischemia, unspecified transient cerebral ischemia  type    Symptoms most likely c/w TIA. Imaging was negative for any acute changes.  CHADS-VASC score of 2.  Consult to Neurology and they recommended admission for TIA workup. Will admit to [facility].    Rosemarie Ax, MD PGY-3, [facility] 07/12/2015, 3:04 PM    Rosemarie Ax, MD 07/12/15 [place], MD 07/12/15 2052

## 2015-07-12 NOTE — ED Notes (Signed)
Patient returned form MRI

## 2015-07-12 NOTE — Progress Notes (Signed)
Patient is back from [facility]. 

## 2015-07-12 NOTE — H&P (Signed)
[facility] History and Physical                                                                                    Duane Mercado, is a 74 y.o. male  MRN: [id number]   DOB - November 23, 1940  Admit Date - 07/12/2015  Outpatient Primary MD for the patient is [name], MD  Referring Physician:  Dr. Raeford Razor  Chief Complaint:   Chief Complaint  Patient presents with  . Weakness     HPI  Duane Mercado  is a 74 y.o. male, actively working dentist, with atrial flutter, asthma and bradycardia who presents to the ER with right sided deficits and slurred speech. Patient reports that he has been generally fatigued over the past several months. He does not feel like he has energy to do what he used to do. Last night at approximately 8 PM after having 2 glasses of wine the patient had difficulty walking. He stumbled and was unable to take a full step. He attributed his to faulty walking to the second glass of wine. He continued to have difficulty walking and developed right-sided upper extremity weakness and decreased sensation in his fingertips. The patient had difficulty sleeping and awoke during the night with several times with cold sweats.  This morning his niece and coworker noticed an asymmetric smile and slurred speech.  Of note the patient has a history of right DVT approximately 25 years ago. Further, he was on Coumadin for atrial flutter but decided to stop taking continuing it and begin utilizing an 81 mg aspirin for antiplatelet therapy. He reports that he has known his pulse rate was slow (mid 30s) for the past year.    In the ER the patient's pulse rate is in the 30s. His EKG shows atrial flutter. His labs are reassuring. Point-of-care troponin is 0.02. Urinalysis and UDS is pending. MR brain is negative for stroke. He was seen by neurology who was like for him to be admitted for a TIA workup.     Review of Systems   In addition to the HPI above,  No Fever-chills, No Headache, No  changes with Vision or hearing, No problems swallowing food or Liquids, No Chest pain, Cough or Shortness of Breath, No Abdominal pain, No Nausea or Vomiting, Bowel movements are regular, No Blood in stool or Urine, No dysuria, ++ He has a small skin rash and scrape near his right knee, he works in his yard frequently.  ++ Complains of right-sided calf pain, as well as leg cramps at night. No new weakness, tingling, numbness in any extremity, No recent weight gain or loss, A full 10 point Review of Systems was done, except as stated above, all other Review of Systems were negative.  Past Medical History  Past Medical History  Diagnosis Date  . Asthma     w seasonal rhinitis  . Sigmoid diverticulosis   . Hx of cardiovascular stress test     coronary artery calcifications with normal nuclear study in 2010  . Tubulovillous adenoma polyp of colon   . Coronary artery calcification seen on CAT scan     nomal nuclear stress test  .  Paroxysmal atrial flutter [facility])     Past Surgical History  Procedure Laterality Date  . Tubulovillous adenomatous  12/2008    polyps removed colonoscopically   partial gastrectomy approximately 25 years ago. More recently he had a lysis of adhesions.    Social History Social History  Substance Use Topics  . Smoking status: Never Smoker   . Smokeless tobacco: Not on file  . Alcohol Use: 0.0 oz/week    0 Standard drinks or equivalent per week     Comment: once daily wine   drinks a glass of wine daily. With independently. Is actively employed as a Pharmacist, community.  Family History Family History  Problem Relation Age of Onset  . Hypertension Mother   . Hypertension Father   . Heart disease Father   . Melanoma Father    mother died of consultations from Alzheimer's. Father died at age 2 with diabetes mellitus, melanoma  Prior to Admission medications   Medication Sig Start Date End Date Taking? Authorizing Provider  aspirin 81 MG tablet Take 81 mg by  mouth daily.   Yes Historical Provider, MD  Multiple Vitamin (MULTIVITAMIN WITH MINERALS) TABS tablet Take 1 tablet by mouth daily.   Yes Historical Provider, MD    No Known Allergies  Physical Exam  Vitals  Blood pressure 144/61, pulse 34, temperature 98 F (36.7 C), temperature source Oral, resp. rate 15, weight 105 kg (231 lb 7.7 oz), SpO2 99 %.   General: Well-developed, well-nourished male, lying in bed in NAD, niece and son at bedside  Psych:  Normal affect and insight, Not Suicidal or Homicidal, Awake Alert, Oriented X 3.  Neuro:   Sensation slightly decreased in right-sided extremities.  Strength slightly asymmetric left side stronger than Right. Smile slightly asymmetric. No tongue deviation. No other cranial nerve abnormalities noted.  ENT:  Ears and Eyes appear Normal, Conjunctivae clear, PER. Moist oral mucosa without erythema or exudates.  Neck:  Supple, No lymphadenopathy appreciated  Respiratory:  Symmetrical chest wall movement, Good air movement bilaterally, CTAB.  Cardiac: Bradycardia, No Murmurs, no LE edema noted, no JVD.    Abdomen:  Positive bowel sounds, Soft, Non tender, Non distended,  No masses appreciated  Skin:  No Cyanosis, Normal Skin Turgor, small pink rash noted around scrape on medial side of right knee, bruise noted on right forearm.  Extremities:  Able to move all 4. ,  no effusions.  Data Review  CBC  Recent Labs Lab 07/12/15 0851  WBC 5.6  HGB 14.7  HCT 42.5  PLT 151  MCV 92.2  MCH 31.9  MCHC 34.6  RDW 13.4  LYMPHSABS 1.1  MONOABS 0.4  EOSABS 0.1  BASOSABS 0.0    Chemistries   Recent Labs Lab 07/12/15 0851  NA 140  K 4.0  CL 108  CO2 21*  GLUCOSE 111*  BUN 18  CREATININE 0.84  CALCIUM 9.0  AST 33  ALT 26  ALKPHOS 70  BILITOT 1.0     Coagulation profile  Recent Labs Lab 07/12/15 0851  INR 1.15    Urinalysis-pending  Imaging results:   Ct Head Wo Contrast  07/12/2015   CLINICAL DATA:  Concern  for stroke. Unsteady gait. Right-sided weakness and numbness.  EXAM: CT HEAD WITHOUT CONTRAST  TECHNIQUE: Contiguous axial images were obtained from the base of the skull through the vertex without intravenous contrast.  COMPARISON:  None.  FINDINGS: Mild atrophy, typical for age. Negative for hydrocephalus. Mild chronic microvascular ischemic change in the white  matter.  Negative for acute infarct.  Negative for hemorrhage or mass.  Normal calvarium. Mucosal edema in the paranasal sinuses. No air-fluid level. Mild vascular calcification  IMPRESSION: Mild chronic microvascular ischemic change. No acute intracranial abnormality.  Mucosal thickening in the paranasal sinuses.   Electronically Signed   By: Franchot Gallo M.D.   On: 07/12/2015 10:40   Mr Brain Wo Contrast (neuro Protocol)  07/12/2015   CLINICAL DATA:  Generalized weakness beginning last night. Unsteady gait. Tingling in the right hand.  EXAM: MRI HEAD WITHOUT CONTRAST  TECHNIQUE: Multiplanar, multiecho pulse sequences of the brain and surrounding structures were obtained without intravenous contrast.  COMPARISON:  Head CT same day  FINDINGS: Diffusion imaging does not show any acute or subacute infarction. The brainstem and cerebellum are normal. The cerebral hemispheres show a few scattered foci of T2 and FLAIR signal within the white matter consistent with minimal small vessel change. No cortical or large vessel territory infarction. No mass lesion, hemorrhage, hydrocephalus or extra-axial collection. No pituitary mass. There is mucosal thickening of the paranasal sinuses. Major vessels at the base of the brain show flow.  IMPRESSION: No acute intracranial finding. Mild small vessel change of the cerebral hemispheric white matter.  Mucosal inflammation of the paranasal sinuses.   Electronically Signed   By: Nelson Chimes M.D.   On: 07/12/2015 12:12    My personal review of EKG: a flutter with bradycardia (30s)   Assessment & Plan  Principal  Problem:   TIA (transient ischemic attack) Active Problems:   Bradycardia   Atrial fibrillation ([place])    TIA Patient with a negative MRI. Symptoms now resolving. UA, UDS is still pending.  We will admit and pursue TIA workup as recommended by neurology. Patient has been taking an 81 mg aspirin daily. He has atrial flutter and history of DVT. He discontinued Coumadin several months ago.  Neurology recommends full dose aspirin and possible novel anticoagulates if okay with cardiology.  Right lower extremity pain History of DVT in same leg. No cord felt on exam. Will check lower extremity Dopplers. Currently on full dose aspirin and prophylactic Lovenox  Bradycardia with atrial fibrillation/atrial flutter Could be contributing to patient's symptoms. Is not on any rate controlling medications. Appreciate cardiology recommendations. CHADSVASC score is 2.  Cardiology considering pacemaker placement.  Electrophysiology will see the patient in the a.m.   Consultants Called:  Cardiology, neurology  Family Communication:   Niece at bedside  Code Status:  Full code  Condition:  Guarded.  Potential Disposition:  Likely to home in 3-4 days  Time spent in minutes : [place],  [place] on 07/12/2015 at 4:53 PM Between 7am to 7pm - Pager - [phone] After 7pm go to [email or web address] - password [email or web address] And look for the night coverage person covering me after hours

## 2015-07-12 NOTE — ED Notes (Signed)
Pt reports last night at 8 pm felt onset on generalized weakness, this morning the weakness continues and he was able to get dressed/shower/shave but felt it was in slow motion. Left side hand feels normal but right side feels different. No facial droop noted. Pt is a x 4.

## 2015-07-12 NOTE — Consult Note (Addendum)
Referring Physician: Little    Chief Complaint: right sided weakness  HPI:                                                                                                                                         Duane Mercado is an 74 y.o. male with known PAF and Aflutter.  He was on coumadin in the past but due to not wanting to have blood draws he stopped taking coumadin and was placed on Asprin. He had been doing well until last night around 8 PM when he noted he was having some difficulty walking (not able to pick up either foot properly and tended to shuffle his feet). He went to sleep thinking he was tired. The next day he noted he was clumsy with his right hand (he is left handed) and noted some subjective decreased sensation in his right hand. Furthermore, his nice said that she thinks patient is having slurred speech since last night Denies HA, vertigo, double vision, trouble swallowing, or vision impairment.  Date last known well: Date: 07/11/2015 Time last known well: Time: 20:00 tPA Given: No: out of window Modified Rankin: Rankin Score=0    Past Medical History  Diagnosis Date  . Asthma     w seasonal rhinitis  . Sigmoid diverticulosis   . Hx of cardiovascular stress test     coronary artery calcifications with normal nuclear study in 2010  . Tubulovillous adenoma polyp of colon   . Coronary artery calcification seen on CAT scan     nomal nuclear stress test  . Paroxysmal atrial flutter [facility])     Past Surgical History  Procedure Laterality Date  . Tubulovillous adenomatous  12/2008    polyps removed colonoscopically    Family History  Problem Relation Age of Onset  . Hypertension Mother   . Hypertension Father    Social History:  reports that he has never smoked. He does not have any smokeless tobacco history on file. He reports that he drinks alcohol. He reports that he does not use illicit drugs. Family history: no MS, brain tumor, or epilepsy Allergies: No Known  Allergies  Medications:                                                                                                                           No current facility-administered medications for  this encounter.   Current Outpatient Prescriptions  Medication Sig Dispense Refill  . aspirin 81 MG tablet Take 81 mg by mouth daily.       ROS:                                                                                                                                       History obtained from the patient  General ROS: negative for - chills, fatigue, fever, night sweats, weight gain or weight loss Psychological ROS: negative for - behavioral disorder, hallucinations, memory difficulties, mood swings or suicidal ideation Ophthalmic ROS: negative for - blurry vision, double vision, eye pain or loss of vision ENT ROS: negative for - epistaxis, nasal discharge, oral lesions, sore throat, tinnitus or vertigo Allergy and Immunology ROS: negative for - hives or itchy/watery eyes Hematological and Lymphatic ROS: negative for - bleeding problems, bruising or swollen lymph nodes Endocrine ROS: negative for - galactorrhea, hair pattern changes, polydipsia/polyuria or temperature intolerance Respiratory ROS: negative for - cough, hemoptysis, shortness of breath or wheezing Cardiovascular ROS: negative for - chest pain, dyspnea on exertion, edema or irregular heartbeat Gastrointestinal ROS: negative for - abdominal pain, diarrhea, hematemesis, nausea/vomiting or stool incontinence Genito-Urinary ROS: negative for - dysuria, hematuria, incontinence or urinary frequency/urgency Musculoskeletal ROS: negative for - joint swelling or muscular weakness Neurological ROS: as noted in HPI Dermatological ROS: negative for rash and skin lesion changes  Physical Examination:                                                                                                      Blood pressure 159/68, pulse 34,  temperature 98 F (36.7 C), temperature source Oral, resp. rate 18, weight 105 kg (231 lb 7.7 oz), SpO2 99 %.  HEENT-  Normocephalic, no lesions, without obvious abnormality.  Normal external eye and conjunctiva.  Normal TM's bilaterally.  Normal auditory canals and external ears. Normal external nose, mucus membranes and septum.  Normal pharynx. Cardiovascular- irregularly irregular rhythm, pulses palpable throughout   Lungs- chest clear, no wheezing, rales, normal symmetric air entry Abdomen- normal findings: bowel sounds normal Extremities- no edema Lymph-no adenopathy palpable Musculoskeletal-no joint tenderness, deformity or swelling Skin-warm and dry, no hyperpigmentation, vitiligo, or suspicious lesions  Neurological Examination Mental Status: Alert, oriented, thought content appropriate.  Speech fluent without evidence of aphasia.  Able to follow 3 step commands without difficulty. Cranial Nerves: II: Discs flat bilaterally; Visual fields grossly normal, pupils equal, round, reactive to light and  accommodation III,IV, VI: ptosis not present, extra-ocular motions intact bilaterally V,VII: smile symmetric with right NL fold decrease, facial light touch sensation normal bilaterally VIII: hearing normal bilaterally IX,X: uvula rises symmetrically XI: bilateral shoulder shrug XII: midline tongue extension Motor: Right : Upper extremity   5/5    Left:     Upper extremity   5/5  Lower extremity   5/5     Lower extremity   5/5 Tone and bulk:normal tone throughout; no atrophy noted Sensory: Pinprick and light touch intact throughout, bilaterally Deep Tendon Reflexes: 2+ and symmetric throughout Plantars: Right: downgoing   Left: downgoing Cerebellar: normal finger-to-nose on the left and slight dysmetria on the right. clumsy finger to finger on the right, normal rapid alternating movements and normal heel-to-shin test Gait: normal gait and station       Lab Results: Basic  Metabolic Panel:  Recent Labs Lab 07/12/15 0851  NA 140  K 4.0  CL 108  CO2 21*  GLUCOSE 111*  BUN 18  CREATININE 0.84  CALCIUM 9.0    Liver Function Tests:  Recent Labs Lab 07/12/15 0851  AST 33  ALT 26  ALKPHOS 70  BILITOT 1.0  PROT 6.4*  ALBUMIN 3.9   No results for input(s): LIPASE, AMYLASE in the last 168 hours. No results for input(s): AMMONIA in the last 168 hours.  CBC:  Recent Labs Lab 07/12/15 0851  WBC 5.6  NEUTROABS 4.0  HGB 14.7  HCT 42.5  MCV 92.2  PLT 151    Cardiac Enzymes: No results for input(s): CKTOTAL, CKMB, CKMBINDEX, TROPONINI in the last 168 hours.  Lipid Panel: No results for input(s): CHOL, TRIG, HDL, CHOLHDL, VLDL, LDLCALC in the last 168 hours.  CBG: No results for input(s): GLUCAP in the last 168 hours.  Microbiology: No results found for this or any previous visit.  Coagulation Studies:  Recent Labs  07/12/15 0851  LABPROT 14.9  INR 1.15    Imaging: Ct Head Wo Contrast  07/12/2015   CLINICAL DATA:  Concern for stroke. Unsteady gait. Right-sided weakness and numbness.  EXAM: CT HEAD WITHOUT CONTRAST  TECHNIQUE: Contiguous axial images were obtained from the base of the skull through the vertex without intravenous contrast.  COMPARISON:  None.  FINDINGS: Mild atrophy, typical for age. Negative for hydrocephalus. Mild chronic microvascular ischemic change in the white matter.  Negative for acute infarct.  Negative for hemorrhage or mass.  Normal calvarium. Mucosal edema in the paranasal sinuses. No air-fluid level. Mild vascular calcification  IMPRESSION: Mild chronic microvascular ischemic change. No acute intracranial abnormality.  Mucosal thickening in the paranasal sinuses.   Electronically Signed   By: Franchot Gallo M.D.   On: 07/12/2015 10:40   Mr Brain Wo Contrast (neuro Protocol)  07/12/2015   CLINICAL DATA:  Generalized weakness beginning last night. Unsteady gait. Tingling in the right hand.  EXAM: MRI HEAD  WITHOUT CONTRAST  TECHNIQUE: Multiplanar, multiecho pulse sequences of the brain and surrounding structures were obtained without intravenous contrast.  COMPARISON:  Head CT same day  FINDINGS: Diffusion imaging does not show any acute or subacute infarction. The brainstem and cerebellum are normal. The cerebral hemispheres show a few scattered foci of T2 and FLAIR signal within the white matter consistent with minimal small vessel change. No cortical or large vessel territory infarction. No mass lesion, hemorrhage, hydrocephalus or extra-axial collection. No pituitary mass. There is mucosal thickening of the paranasal sinuses. Major vessels at the base of the brain show flow.  IMPRESSION: No acute intracranial finding. Mild small vessel change of the cerebral hemispheric white matter.  Mucosal inflammation of the paranasal sinuses.   Electronically Signed   By: Nelson Chimes M.D.   On: 07/12/2015 12:12       Assessment and plan discussed with with attending physician and they are in agreement.    Etta Quill PA-C [facility] [phone]  07/12/2015, 1:58 PM   Assessment: 74 y.o. male with <24 hours of right facial droop and clumsiness of his right hand.  MRI brain was obtained and shows no acute stroke. Exam is still consistent with right facial droop and right hand clumsiness. Although MRI is negative cannot exclude MRI negative ischemic stroke versus TIA in the setting of of current atrial flutter/Afib and no AC.  Presently, CHA2DS2-VASc 5 with yearly stroke risk above 9 % and thus will suggest starting patient on a NOAC. Stroke team will follow up tomorrow.  Stroke Risk Factors - atrial fibrillation and hypertension   1. HgbA1c, fasting lipid panel 2. MRA  of the brain without contrast 3. PT consult, OT consult, Speech consult 4. Echocardiogram 5. Carotid dopplers 6. Prophylactic therapy-Antiplatelet med: Aspirin - dose 325 mg daily--cardiology consult for possible novel  AC 7. Risk factor modification 8. Telemetry monitoring 9. Frequent neuro checks 10 NPO until passes stroke swallow screen   Patient seen and examined together with physician assistant and I concur with the assessment and plan.  Dorian Pod, MD

## 2015-07-12 NOTE — Consult Note (Signed)
CARDIOLOGY CONSULT NOTE   Patient ID: Duane Mercado MRN: [id number], DOB/AGE: [birth year]   Admit date: 07/12/2015 Date of Consult: 07/12/2015   Primary Physician: [name], MD Primary Cardiologist: Dr. Radford Pax  Pt. 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 Asthma     w seasonal rhinitis  . Sigmoid diverticulosis   . Hx of cardiovascular stress test     coronary artery calcifications with normal nuclear study in 2010  . Tubulovillous adenoma polyp of colon   . Coronary artery calcification seen on CAT scan     nomal nuclear stress test  . Paroxysmal atrial flutter [facility])     Past Surgical History  Procedure Laterality Date  . 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 Patient has been previously followed by Dr. Rodell Perna and underwent DCCV in 2003 and 2007. At that time he was still on Coumadin and flecainide. He is not sure when he was taken off Coumadin and flecainide, however has since been transitioned to ASA which he takes daily. He denies any previous history of stroke, or any significant bleeding episode requiring transfusion. He did have part of bowel/stomach taken out more than 10 yrs ago. He also had an episode of small bowel obstruction 7 years ago. To this day, despite his advanced age, he continued to work as a Pharmacist, community. He doesn't do much strenuous activity, last exercise was 2 weeks ago when he rode bicycle which increased his HR to  mid 75s. In the last 2-3 month, he has been more aware of his heart rate due to increasing fatigue, he noticed his heart rate has been in the 30s for the past 3 month. Previously, 4-5 years ago, his heart rate was in the 60s. This corresponded to a time when he feel lack of energy, however he denies any dizziness or presyncope. He was not aware any palpitation. Otherwise, he denies any recent edema, fever, chill or cough.  On the night of 07/11/2015, he had significant fatigue and felt very imbalanced. He also noticed slightly worsening right-sided lower extremity weakness when he walked. Family member states his speech was a little slurred. He went to sleep and awoke up this morning continued to have similar symptom. He was brought to [facility] for further evaluation. On arrival, he was noted to have atrial fibrillation of unknown duration with slow ventricular rate in the 30s. He is not on any AV nodal blocking agent. Last EKG in the system was during his follow-up with Dr. Radford Pax in January 2015 which showed sinus bradycardia with first-degree AV block. Patient did not realize he went back into atrial fibrillation. Neurology was first consulted given the concern for stroke. So far patient has negative CT of the head and negative MRI of the brain. Per neurology, cannot rule out MRI negative cerebral ischemia. Cardiology was consulted as well for bradycardia and atrial fibrillation.   No current facility-administered medications on file prior to encounter.  Current Outpatient Prescriptions on File Prior to Encounter  Medication Sig Dispense Refill  . aspirin 81 MG tablet Take 81 mg by mouth daily.       Family History Family History  Problem Relation Age of Onset  . Hypertension Mother   . Hypertension Father   . Heart disease Father   . Melanoma Father      Social History Social History   Social History  . Marital Status: Divorced    Spouse Name: N/A  . Number of Children: N/A   . Years of Education: N/A   Occupational History  . Not on file.   Social History Main Topics  . Smoking status: Never Smoker   . Smokeless tobacco: Not on file  . Alcohol Use: 0.0 oz/week    0 Standard drinks or equivalent per week     Comment: once daily wine  . Drug Use: No  . Sexual Activity: Not on file   Other Topics Concern  . Not on file   Social History Narrative     Review of Systems  General:  No chills, fever, night sweats or weight changes.  Cardiovascular:  No chest pain, dyspnea on exertion, edema, orthopnea, palpitations, paroxysmal nocturnal dyspnea. No palpitation or cardiac awareness Dermatological: No rash, lesions/masses +R leg pain, but no SOB. Respiratory: No cough, dyspnea Urologic: No hematuria, dysuria Abdominal:   No nausea, vomiting, diarrhea, bright red blood per rectum, melena, or hematemesis Neurologic:  No visual changes, changes in mental status. +weakness and imbalance All other systems reviewed and are otherwise negative except as noted above.  Physical Exam  Blood pressure 144/61, pulse 34, temperature 98 F (36.7 C), temperature source Oral, resp. rate 15, weight 231 lb 7.7 oz (105 kg), SpO2 99 %.  General: Pleasant, NAD Psych: Normal affect. Neuro: Alert and oriented X 3. Moves all extremities spontaneously. HEENT: Normal  Neck: Supple without bruits or JVD. Lungs:  Resp regular and unlabored, CTA. Heart: bradycardic. no s3, s4, or murmurs. Abdomen: Soft, non-tender, non-distended, BS + x 4.  Extremities: No clubbing, cyanosis or edema. DP/PT/Radials 2+ and equal bilaterally.  Labs  No results for input(s): CKTOTAL, CKMB, TROPONINI in the last 72 hours. Lab Results  Component Value Date   WBC 5.6 07/12/2015   HGB 14.7 07/12/2015   HCT 42.5 07/12/2015   MCV 92.2 07/12/2015   PLT 151 07/12/2015     Recent Labs Lab 07/12/15 0851  NA 140  K 4.0  CL 108  CO2 21*  BUN 18  CREATININE 0.84  CALCIUM 9.0  PROT 6.4*    BILITOT 1.0  ALKPHOS 70  ALT 26  AST 33  GLUCOSE 111*   Radiology/Studies  Ct Head Wo Contrast  07/12/2015   CLINICAL DATA:  Concern for stroke. Unsteady gait. Right-sided weakness and numbness.  EXAM: CT HEAD WITHOUT CONTRAST  TECHNIQUE: Contiguous axial images were obtained from the base of the skull through the vertex without intravenous contrast.  COMPARISON:  None.  FINDINGS: Mild atrophy, typical for age. Negative for hydrocephalus. Mild chronic microvascular ischemic change in the white matter.  Negative for acute infarct.  Negative for hemorrhage or mass.  Normal calvarium. Mucosal edema in the paranasal sinuses. No air-fluid level. Mild vascular calcification  IMPRESSION: Mild chronic microvascular ischemic change. No acute intracranial abnormality.  Mucosal thickening in the paranasal sinuses.   Electronically Signed   By: Franchot Gallo M.D.   On: 07/12/2015 10:40   Mr Brain Wo Contrast (neuro Protocol)  07/12/2015   CLINICAL DATA:  Generalized weakness beginning last night. Unsteady gait. Tingling in the right hand.  EXAM: MRI HEAD WITHOUT CONTRAST  TECHNIQUE: Multiplanar, multiecho pulse sequences of the brain and surrounding structures were obtained without intravenous contrast.  COMPARISON:  Head CT same day  FINDINGS: Diffusion imaging does not show any acute or subacute infarction. The brainstem and cerebellum are normal. The cerebral hemispheres show a few scattered foci of T2 and FLAIR signal within the white matter consistent with minimal small vessel change. No cortical or large vessel territory infarction. No mass lesion, hemorrhage, hydrocephalus or extra-axial collection. No pituitary mass. There is mucosal thickening of the paranasal sinuses. Major vessels at the base of the brain show flow.  IMPRESSION: No acute intracranial finding. Mild small vessel change of the cerebral hemispheric white matter.  Mucosal inflammation of the paranasal sinuses.   Electronically Signed   By:  Nelson Chimes M.D.   On: 07/12/2015 12:12    ECG  Atrial fibrillation with slow ventricular heart rate 30s.  ASSESSMENT AND PLAN  1. atrial fibrillation of unknown duration with slow ventricular heart rate  - CHA2DS2-VASC score 2 (Age)  - Atrial fibrillation likely has persisted for >3 month based on his symptom.  - He denies history of stroke or significant bleeding requiring transfusion, likely good candidate for NOAC such as eliquis, but would likely to initiate after PPM which hopefully will correct his loss of energy and imbalance issue  - IV heparin if ok with neurology before start of NOAC  2. Generalized weakness, right upper worse than left.  - Neurology following, CT and MRI of the brain negative so far.  - Differential diagnosis of his symptom could either be neurologically related versus severe bradycardia.  - Likely need PPM, will ask EP to see in AM  3. History of coronary calcification seen on CT with normal nuclear stress test  - no recent anginal symptom  4. History of atrial flutter s/p multiple DC cardioversion  5. OSA not on CPAP: Per patient, he had a sleep study several years ago with non-conclusive result, he did not do another sleep study since.   Hilbert Corrigan, PA-C 07/12/2015, 4:40 PM  Patient seen, examined. Available data reviewed. Agree with findings, assessment, and plan as outlined by Almyra Deforest, PA-C. The patient is independently interviewed and examined. Exam reveals a pleasant elderly male in NAD. Carotids are 2+= without bruit, JVP normal, lungs CTA, heart RRR without murmur, no pretibial edema. EKG shows atrial fib with slow ventricular response 38 bpm (competing junctional pacemaker). Symptoms worrisome for stroke/TIA but CT and MRI imaging of the brain is normal. Neurology has evaluated the patient. His bradycardia has been present for several months and clearly predates this episode of gait unsteadiness, slurred speech, etc. However, with marked  bradycardia and HR only up to 42 with exercise, I think a pacemaker is indicated. Need to check an echocardiogram to make certain LV function is preserved. The patient should be anticoagulated after all procedures are completed. Will ask EP team to see him in the morning. Pt's son at bedside. He understands overall plan.   Sherren Mocha, M.D. 07/12/2015 5:21 PM

## 2015-07-12 NOTE — ED Notes (Signed)
MD Burt Knack remains at bedside

## 2015-07-12 NOTE — ED Notes (Signed)
MD Cooper at bedside  

## 2015-07-13 ENCOUNTER — Observation Stay (HOSPITAL_COMMUNITY): Payer: Medicare Other

## 2015-07-13 ENCOUNTER — Encounter (HOSPITAL_COMMUNITY)
Admission: EM | Disposition: A | Discharge status: Home / Self Care | Payer: Self-pay | Source: Home / Self Care | Attending: Internal Medicine

## 2015-07-13 ENCOUNTER — Observation Stay (HOSPITAL_BASED_OUTPATIENT_CLINIC_OR_DEPARTMENT_OTHER): Payer: Medicare Other

## 2015-07-13 DIAGNOSIS — G459 Transient cerebral ischemic attack, unspecified: Principal | ICD-10-CM

## 2015-07-13 DIAGNOSIS — I2510 Atherosclerotic heart disease of native coronary artery without angina pectoris: Secondary | ICD-10-CM | POA: Diagnosis present

## 2015-07-13 DIAGNOSIS — I4891 Unspecified atrial fibrillation: Secondary | ICD-10-CM

## 2015-07-13 DIAGNOSIS — I442 Atrioventricular block, complete: Secondary | ICD-10-CM | POA: Diagnosis not present

## 2015-07-13 DIAGNOSIS — Z7982 Long term (current) use of aspirin: Secondary | ICD-10-CM | POA: Diagnosis not present

## 2015-07-13 DIAGNOSIS — I480 Paroxysmal atrial fibrillation: Secondary | ICD-10-CM | POA: Diagnosis present

## 2015-07-13 DIAGNOSIS — Z79899 Other long term (current) drug therapy: Secondary | ICD-10-CM | POA: Diagnosis not present

## 2015-07-13 DIAGNOSIS — E785 Hyperlipidemia, unspecified: Secondary | ICD-10-CM | POA: Diagnosis not present

## 2015-07-13 DIAGNOSIS — J45909 Unspecified asthma, uncomplicated: Secondary | ICD-10-CM | POA: Diagnosis present

## 2015-07-13 DIAGNOSIS — I10 Essential (primary) hypertension: Secondary | ICD-10-CM | POA: Diagnosis present

## 2015-07-13 DIAGNOSIS — I481 Persistent atrial fibrillation: Secondary | ICD-10-CM | POA: Diagnosis not present

## 2015-07-13 DIAGNOSIS — M79606 Pain in leg, unspecified: Secondary | ICD-10-CM | POA: Diagnosis not present

## 2015-07-13 DIAGNOSIS — G4733 Obstructive sleep apnea (adult) (pediatric): Secondary | ICD-10-CM | POA: Diagnosis present

## 2015-07-13 DIAGNOSIS — Z86718 Personal history of other venous thrombosis and embolism: Secondary | ICD-10-CM | POA: Diagnosis not present

## 2015-07-13 DIAGNOSIS — M79604 Pain in right leg: Secondary | ICD-10-CM | POA: Diagnosis present

## 2015-07-13 DIAGNOSIS — G8191 Hemiplegia, unspecified affecting right dominant side: Secondary | ICD-10-CM | POA: Diagnosis not present

## 2015-07-13 DIAGNOSIS — R001 Bradycardia, unspecified: Secondary | ICD-10-CM | POA: Diagnosis not present

## 2015-07-13 DIAGNOSIS — I499 Cardiac arrhythmia, unspecified: Secondary | ICD-10-CM | POA: Diagnosis not present

## 2015-07-13 DIAGNOSIS — I4892 Unspecified atrial flutter: Secondary | ICD-10-CM | POA: Diagnosis present

## 2015-07-13 DIAGNOSIS — I482 Chronic atrial fibrillation: Secondary | ICD-10-CM | POA: Diagnosis not present

## 2015-07-13 DIAGNOSIS — Z950 Presence of cardiac pacemaker: Secondary | ICD-10-CM | POA: Diagnosis not present

## 2015-07-13 HISTORY — PX: EP IMPLANTABLE DEVICE: SHX172B

## 2015-07-13 LAB — URINALYSIS, ROUTINE W REFLEX MICROSCOPIC
Bilirubin Urine: NEGATIVE
Glucose, UA: NEGATIVE mg/dL
Hgb urine dipstick: NEGATIVE
KETONES UR: NEGATIVE mg/dL
LEUKOCYTES UA: NEGATIVE
NITRITE: NEGATIVE
PH: 6 (ref 5.0–8.0)
Protein, ur: NEGATIVE mg/dL
Specific Gravity, Urine: 1.023 (ref 1.005–1.030)
Urobilinogen, UA: 1 mg/dL (ref 0.0–1.0)

## 2015-07-13 LAB — LIPID PANEL
CHOL/HDL RATIO: 3.6 ratio
CHOLESTEROL: 167 mg/dL (ref 0–200)
HDL: 46 mg/dL (ref >40)
LDL CALC: 108 mg/dL — AB (ref 0–99)
TRIGLYCERIDES: 64 mg/dL (ref <150)
VLDL: 13 mg/dL (ref 0–40)

## 2015-07-13 LAB — HEMOGLOBIN A1C
Hgb A1c MFr Bld: 5.9 % — ABNORMAL HIGH (ref 4.8–5.6)
Mean Plasma Glucose: 123 mg/dL

## 2015-07-13 LAB — RAPID URINE DRUG SCREEN, HOSP PERFORMED
Amphetamines: NOT DETECTED
BARBITURATES: NOT DETECTED
Benzodiazepines: NOT DETECTED
Cocaine: NOT DETECTED
Opiates: NOT DETECTED
Tetrahydrocannabinol: NOT DETECTED

## 2015-07-13 SURGERY — PACEMAKER IMPLANT

## 2015-07-13 MED ORDER — ONDANSETRON HCL 4 MG/2ML IJ SOLN: 4.0000 mg | Freq: Four times a day (QID) | INTRAMUSCULAR | Status: DC | PRN

## 2015-07-13 MED ORDER — CEFAZOLIN SODIUM-DEXTROSE 2-3 GM-% IV SOLR
2.0000 g | INTRAVENOUS | Status: AC
  Administered 2015-07-13: 2 g via INTRAVENOUS

## 2015-07-13 MED ORDER — SODIUM CHLORIDE 0.9 % IV SOLN
INTRAVENOUS | Status: DC
  Administered 2015-07-13: 14:00:00 via INTRAVENOUS

## 2015-07-13 MED ORDER — FENTANYL CITRATE (PF) 100 MCG/2ML IJ SOLN
INTRAMUSCULAR | Status: DC | PRN
  Administered 2015-07-13: 12.5 ug via INTRAVENOUS

## 2015-07-13 MED ORDER — HYDRALAZINE HCL 10 MG PO TABS
10.0000 mg | ORAL_TABLET | ORAL | Status: DC | PRN
  Administered 2015-07-13: 10 mg via ORAL
  Filled 2015-07-13: qty 1

## 2015-07-13 MED ORDER — IOHEXOL 350 MG/ML SOLN
INTRAVENOUS | Status: DC | PRN
  Administered 2015-07-13: 50 mL via INTRAVENOUS

## 2015-07-13 MED ORDER — SODIUM CHLORIDE 0.9 % IR SOLN
80.0000 mg | Status: AC
  Administered 2015-07-13: 80 mg
  Filled 2015-07-13: qty 2

## 2015-07-13 MED ORDER — LISINOPRIL 10 MG PO TABS
20.0000 mg | ORAL_TABLET | Freq: Every day | ORAL | Status: DC
  Administered 2015-07-14: 20 mg via ORAL
  Filled 2015-07-13: qty 2

## 2015-07-13 MED ORDER — CEFAZOLIN SODIUM-DEXTROSE 2-3 GM-% IV SOLR
INTRAVENOUS | Status: AC
  Filled 2015-07-13: qty 50

## 2015-07-13 MED ORDER — ATORVASTATIN CALCIUM 20 MG PO TABS
20.0000 mg | ORAL_TABLET | Freq: Every day | ORAL | Status: DC
  Administered 2015-07-13: 20 mg via ORAL
  Filled 2015-07-13 (×2): qty 1

## 2015-07-13 MED ORDER — MIDAZOLAM HCL 5 MG/5ML IJ SOLN
INTRAMUSCULAR | Status: DC | PRN
  Administered 2015-07-13: 1 mg via INTRAVENOUS

## 2015-07-13 MED ORDER — MIDAZOLAM HCL 5 MG/5ML IJ SOLN
INTRAMUSCULAR | Status: AC
  Filled 2015-07-13: qty 25

## 2015-07-13 MED ORDER — LIDOCAINE HCL (PF) 1 % IJ SOLN
INTRAMUSCULAR | Status: AC
  Filled 2015-07-13: qty 60

## 2015-07-13 MED ORDER — FENTANYL CITRATE (PF) 100 MCG/2ML IJ SOLN
INTRAMUSCULAR | Status: AC
  Filled 2015-07-13: qty 4

## 2015-07-13 MED ORDER — CEFAZOLIN SODIUM 1-5 GM-% IV SOLN
1.0000 g | Freq: Four times a day (QID) | INTRAVENOUS | Status: AC
  Administered 2015-07-13 – 2015-07-14 (×3): 1 g via INTRAVENOUS
  Filled 2015-07-13 (×5): qty 50

## 2015-07-13 MED ORDER — LIDOCAINE HCL (PF) 1 % IJ SOLN
INTRAMUSCULAR | Status: DC | PRN
Start: 2015-07-13 — End: 2015-07-13
  Administered 2015-07-13: 30 mL

## 2015-07-13 MED ORDER — ACETAMINOPHEN 325 MG PO TABS
325.0000 mg | ORAL_TABLET | ORAL | Status: DC | PRN
  Administered 2015-07-14: 650 mg via ORAL
  Filled 2015-07-13: qty 2

## 2015-07-13 MED ORDER — SODIUM CHLORIDE 0.9 % IR SOLN
Status: AC
  Filled 2015-07-13: qty 2

## 2015-07-13 MED ORDER — HEPARIN (PORCINE) IN NACL 2-0.9 UNIT/ML-% IJ SOLN
INTRAMUSCULAR | Status: DC | PRN
  Administered 2015-07-13: 15:00:00

## 2015-07-13 SURGICAL SUPPLY — 7 items
CABLE SURGICAL S-101-97-12 (CABLE) ×2 IMPLANT
ELECT DEFIB PAD ADLT CADENCE (PAD) ×2 IMPLANT
LEAD CAPSURE NOVUS 5076-52CM (Lead) ×2 IMPLANT
LEAD CAPSURE NOVUS 5076-58CM (Lead) ×2 IMPLANT
PPM ADVISA MRI DR A2DR01 (Pacemaker) ×2 IMPLANT
SHEATH CLASSIC 7F (SHEATH) ×4 IMPLANT
TRAY PACEMAKER INSERTION (CUSTOM PROCEDURE TRAY) ×2 IMPLANT

## 2015-07-13 NOTE — Progress Notes (Signed)
07/13/2015 1615 Received to room 2w15 a pt. From Cath Lab who is S/P pacemaker placement.  Pt is A&O, no c/o voiced.  Tele applied and CCMD notified.  Oriented to room, call light and bed.  Call bell in reach, family at bedside. [place]

## 2015-07-13 NOTE — Progress Notes (Signed)
PT Cancellation Note  Patient Details Name: Nickoles Gregori MRN: [id number] DOB: 10/02/1941   Cancelled Treatment:    Reason Eval/Treat Not Completed: Patient at procedure or test/unavailable; patient down for pacemaker.  Will see tomorrow.   Kendal Ghazarian,CYNDI 07/13/2015, 4:17 PM  Magda Kiel, [place] 07/13/2015

## 2015-07-13 NOTE — Progress Notes (Signed)
STROKE TEAM PROGRESS NOTE   HISTORY Duane Mercado is an 74 y.o. male with known PAF and Aflutter. He was on coumadin in the past but due to not wanting to have blood draws he stopped taking coumadin and was placed on Asprin. He had been doing well until last night 07/11/2015 around 8 PM (LKW) when he noted he was having some difficulty walking (not able to pick up either foot properly and tended to shuffle his feet). He went to sleep thinking he was tired. The next day he noted he was clumsy with his right hand (he is left handed) and noted some subjective decreased sensation in his right hand. Furthermore, his niece said that she thinks patient is having slurred speech since last night. Denies HA, vertigo, double vision, trouble swallowing, or vision impairment. Modified Rankin: Rankin Score=0. Patient was not administered TPA secondary to being out of the window. He was admitted for further evaluation and treatment.   SUBJECTIVE (INTERVAL HISTORY) His office manager and friend Dr. Morene Mercado is at the bedside.  Overall he feels his condition is gradually improving. He recounted his history with Dr. Leonie Mercado. He is scheduled for a pacemaker later today due to complete heart block.   OBJECTIVE Temp:  [97.4 F (36.3 C)-98.2 F (36.8 C)] 98.1 F (36.7 C) (10/06 1043) Pulse Rate:  [32-40] 32 (10/06 1043) Cardiac Rhythm:  [-] Atrial flutter (10/06 0805) Resp:  [11-20] 20 (10/06 1043) BP: (137-189)/(59-76) 172/75 mmHg (10/06 1045) SpO2:  [96 %-99 %] 97 % (10/06 1043)  CBC:  Recent Labs Lab 07/12/15 0851  WBC 5.6  NEUTROABS 4.0  HGB 14.7  HCT 42.5  MCV 92.2  PLT 644    Basic Metabolic Panel:  Recent Labs Lab 07/12/15 0851  NA 140  K 4.0  CL 108  CO2 21*  GLUCOSE 111*  BUN 18  CREATININE 0.84  CALCIUM 9.0    Lipid Panel:    Component Value Date/Time   CHOL 167 07/13/2015 0704   TRIG 64 07/13/2015 0704   HDL 46 07/13/2015 0704   CHOLHDL 3.6 07/13/2015 0704   VLDL 13 07/13/2015  0704   LDLCALC 108* 07/13/2015 0704   HgbA1c:  Lab Results  Component Value Date   HGBA1C 5.9* 07/12/2015   Urine Drug Screen:    Component Value Date/Time   LABOPIA NONE DETECTED 07/13/2015 0227   COCAINSCRNUR NONE DETECTED 07/13/2015 0227   LABBENZ NONE DETECTED 07/13/2015 0227   AMPHETMU NONE DETECTED 07/13/2015 0227   THCU NONE DETECTED 07/13/2015 0227   LABBARB NONE DETECTED 07/13/2015 0227      IMAGING  Dg Chest 2 View  07/12/2015   CLINICAL DATA:  Weakness within the right upper and lower extremities. History of asthma.  EXAM: CHEST  2 VIEW  COMPARISON:  12/19/2010  FINDINGS: Enlarged cardiac silhouette and mediastinal contours. Bilateral infrahilar heterogeneous opacities are unchanged. No new focal airspace opacities. No pleural effusion or pneumothorax. No evidence of edema. No acute osseus abnormalities.  IMPRESSION: Cardiomegaly without definite acute cardiopulmonary disease.   Electronically Signed   By: Duane Mercado M.D.   On: 07/12/2015 18:43   Ct Head Wo Contrast  07/12/2015    Mild chronic microvascular ischemic change. No acute intracranial abnormality.  Mucosal thickening in the paranasal sinuses.     Mr Duane Mercado Head Wo Contrast  07/12/2015    1. Focal severe stenosis within proximal right M2 branch without occlusion. Right MCA branches are mildly attenuated as compared to the left. 2.  Moderate multi focal atheromatous irregularity throughout the posterior cerebral arteries, with more focal moderate stenoses within the right P1/P2 segment and mid left P2 segment. 3. Short-segment moderate stenosis within the supraclinoid right ICA. 4. Distal small vessel atheromatous irregularity.     Mr Brain Wo Contrast (neuro Protocol) 07/12/2015    No acute intracranial finding. Mild small vessel change of the cerebral hemispheric white matter.  Mucosal inflammation of the paranasal sinuses.     Lower extremity venous duplex  No evidence of DVT, superficial thrombosis, or Baker's  Cyst.  Carotid Doppler   There is 1-39% bilateral ICA stenosis. Vertebral artery flow is antegrade.     PHYSICAL EXAM Pleasant elderly Caucasian male not in distress. . Afebrile. Head is nontraumatic. Neck is supple without bruit.    Cardiac exam no murmur or gallop. Lungs are clear to auscultation. Distal pulses are well felt. Neurological Exam :  Awake alert oriented 3 with normal speech and language function. No aphasia or apraxia dysarthria. Extraocular movements are full range without nystagmus. Fundi were not visualized. Vision acuity and fields seem adequate. Face is symmetric without weakness. Tongue is midline. Motor system exam no upper or lower extremity drift but weakness of right grip and intrinsic hand muscles. Orbits left over right upper extremity. Symmetric lower extremity strength. Reflexes are symmetric. Plantars are downgoing. Sensation appears intact bilaterally. Gait was not tested. ASSESSMENT/PLAN [name] is a 74 y.o. male with history of obstructive sleep apnea, atrial fibrillation, not on anticoagulation presenting with left-sided weakness. He did not receive IV t-PA due to delay in arrival.   Non-Dominant R brain Stroke not seen on MRI, too small to be seen on imaging, infarct embolic econdary to atrial fibrillation   Left handed  Resultant  Mild Rt hemiparesis  MRI  No stroke seen  MRA  Focal severe stenosis proximal right M2,  Right MCA branches mildly attenuated. Moderate multi focal atheromatous irregularity throughout posterior cerebral arteries, with focal moderate stenoses within the right P1/P2 segment and mid left P2 segment.  Short-segment moderate stenosis within the supraclinoid right ICA. Distal small vessel atheromatous irregularity.     Lower extremity Doppler negative  Carotid Doppler  No significant stenosis   2D Echo  pending   LDL 108  HgbA1c 5.9  Lovenox 40 mg sq daily for VTE prophylaxis  Diet NPO time specified  Diet NPO  time specified Except for: Sips with Meds  aspirin 81 mg orally every day prior to admission, now on aspirin 325 mg orally every day. Recommend use of Eliquis post pacer  Patient counseled to be compliant with his antithrombotic medications  once they are started  Ongoing aggressive stroke risk factor management  Therapy recommendations:  pending  Disposition:  Anticipate return home  Atrial Fibrillation  Home anticoagulation:  none  Recommend NOAC (eliquis) for secondary stroke prevention  CHA2DS2-VASc Score = 5, ?2 oral anticoagulation recommended  Age in Years:  ?35   +2    Sex:  Male   0  Hypertension History:  yes   +1     Diabetes Mellitus:  0   Congestive Heart Failure History:  0  Vascular Disease History:  0     Stroke/TIA/Thromboembolism History:  yes   +2  Hypertension  Stable  Permissive hypertension from stroke standpoint (OK if < 220/120) but gradually normalize in 5-7 days  Hyperlipidemia  Home meds:  No statin resumed in hospital  LDL 108, goal < 70  Lipitor 20 mg daily  added this admission  Continue statin at discharge  Other Stroke Risk Factors  Advanced age  ETOH use  Obstructive sleep apnea, on CPAP at home  Other Active Problems  Complete heart block with plans for pacer today. Cards plans NOAC following pacer  Right lower extremity pain. History of DVT 25 years ago  Hospital day # [place] for Pager information 07/13/2015 5:22 PM   I have personally examined this patient, reviewed notes, independently viewed imaging studies, participated in medical decision making and plan of care. I have made any additions or clarifications directly to the above note. Agree with note above.  He presented with transient gait ataxia as well as right-sided weakness and clumsiness from suspect left brain subcortical infarct which is not visualized on MRI scan. He does have atrial fibrillation and remains at risk  for recurrent stroke, TIA and neurological worsening and needs ongoing stroke evaluation and aggressive risk factor modification. Agree with long-term anticoagulation with eliquis following his pacemaker insertion. I had a long discussion the patient at the bedside and answered questions  Antony Contras, [place] Pager: [phone] 07/13/2015 5:43 PM   To contact Stroke Continuity provider, please refer to [email or web address] After hours, contact General Neurology

## 2015-07-13 NOTE — Progress Notes (Signed)
VASCULAR LAB PRELIMINARY  PRELIMINARY  PRELIMINARY  PRELIMINARY  Bilateral lower extremity venous duplex and carotid duplex completed.    Preliminary report:    Venous:  Bilateral:  No evidence of DVT, superficial thrombosis, or Baker's Cyst.  Carotid:  Bilateral:  1-39% ICA stenosis.  Vertebral artery flow is antegrade.      Lilie Vezina, RVT 07/13/2015, 9:14 AM

## 2015-07-13 NOTE — Consult Note (Addendum)
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 At that time he was still on Coumadin and flecainide. He is not sure when he was taken off Coumadin and flecainide, however has since been transitioned to ASA which he takes daily. He denies any previous history of stroke, or any significant bleeding episode requiring transfusion. In the last 2-3 month, he has had increasing fatigue; noticed his heart rate has been in the 30s for the past 3 month. Previously, 4-5 years ago, his heart rate was in the 60s. He has felt fatigue and weakness over this time.  He says that he was trying to put off being seen.  He was not aware any palpitation. Otherwise, he denies any recent edema, fever, chill or cough.  On the night of 07/11/2015, he had significant fatigue and felt very imbalanced. He also noticed slightly worsening right-sided lower extremity weakness when he walked. Family member states his speech was a little slurred. He went to sleep and awoke up this morning continued to have similar symptom. He was brought to [facility] for further evaluation. On arrival, he was noted to have atrial fibrillation of unknown duration with slow ventricular rate in the 30s. He is not on any AV nodal blocking agent.  Neurology was first consulted given the concern for stroke. So far patient has negative CT of the head and negative MRI of the brain. Per neurology, cannot rule out MRI negative cerebral ischemia. Cardiology was consulted as well for bradycardia and atrial fibrillation.  Today, he feels well lying in bed.  He denies symptoms of palpitations, chest pain, shortness of breath, orthopnea, PND, lower extremity edema,  dizziness, presyncope, syncope, or neurologic sequela. The patient is tolerating medications without difficulties and is otherwise without complaint today.   Past Medical History  Diagnosis Date  . Asthma     w seasonal rhinitis  . Sigmoid diverticulosis   . Hx of cardiovascular stress test     coronary artery calcifications with normal nuclear study in 2010  . Tubulovillous adenoma polyp of colon   . Coronary artery calcification seen on CAT scan     nomal nuclear stress test  . Paroxysmal atrial flutter [facility])    Past Surgical History  Procedure Laterality Date  . Tubulovillous adenomatous  12/2008    polyps removed colonoscopically    . aspirin EC  325 mg Oral Daily  . enoxaparin (LOVENOX) injection  40 mg Subcutaneous Q24H      No Known Allergies  Social History   Social History  . Marital Status: Divorced    Spouse Name: N/A  . Number of Children: N/A  . Years of Education: N/A   Occupational History  . Not on file.   Social History Main Topics  . Smoking status: Never Smoker   . Smokeless tobacco: Not on file  . Alcohol Use: 0.0 oz/week    0 Standard drinks or equivalent per week     Comment: once daily wine  . Drug Use: No  . Sexual Activity: Yes    Birth Control/ Protection: Condom   Other Topics Concern  . Not on file   Social History Narrative    Family History  Problem Relation Age of Onset  .  Hypertension Mother   . Hypertension Father   . Heart disease Father   . Melanoma Father     ROS- All systems are reviewed and negative except as per the HPI above  Physical Exam: Telemetry: Filed Vitals:   07/12/15 2350 07/13/15 0204 07/13/15 0400 07/13/15 0600  BP: 157/59 170/71 137/76 161/66  Pulse: 34 37 36 35  Temp: 97.6 F (36.4 C) 98.2 F (36.8 C) 97.6 F (36.4 C) 97.5 F (36.4 C)  TempSrc: Oral Oral Oral Oral  Resp: 18 20 19 19   Weight:      SpO2: 97% 97% 96% 98%    GEN- The patient is well appearing, alert and oriented x 3  today.   Head- normocephalic, atraumatic Eyes-  Sclera clear, conjunctiva pink Ears- hearing intact Oropharynx- clear Neck- supple, no JVP Lymph- no cervical lymphadenopathy Lungs- Clear to ausculation bilaterally, normal work of breathing Heart- Regular rate and rhythm, no murmurs, rubs or gallops, PMI not laterally displaced GI- soft, NT, ND, + BS Extremities- no clubbing, cyanosis, or edema MS- no significant deformity or atrophy Skin- no rash or lesion Psych- euthymic mood, full affect Neuro- strength and sensation are intact  EKG-atrial fibrillation with complete heart block, rate 28  Labs:   Lab Results  Component Value Date   WBC 5.6 07/12/2015   HGB 14.7 07/12/2015   HCT 42.5 07/12/2015   MCV 92.2 07/12/2015   PLT 151 07/12/2015    Recent Labs Lab 07/12/15 0851  NA 140  K 4.0  CL 108  CO2 21*  BUN 18  CREATININE 0.84  CALCIUM 9.0  PROT 6.4*  BILITOT 1.0  ALKPHOS 70  ALT 26  AST 33  GLUCOSE 111*   Lab Results  Component Value Date   TROPONINI <0.03 07/12/2015    Lab Results  Component Value Date   CHOL 167 07/13/2015   Lab Results  Component Value Date   HDL 46 07/13/2015   Lab Results  Component Value Date   LDLCALC 108* 07/13/2015   Lab Results  Component Value Date   TRIG 64 07/13/2015   Lab Results  Component Value Date   CHOLHDL 3.6 07/13/2015   No results found for: LDLDIRECT    Radiology:MRA IMPRESSION: 1. Focal severe stenosis within proximal right M2 branch without occlusion. Right MCA branches are mildly attenuated as compared to the left. 2. Moderate multi focal atheromatous irregularity throughout the posterior cerebral arteries, with more focal moderate stenoses within the right P1/P2 segment and mid left P2 segment. 3. Short-segment moderate stenosis within the supraclinoid right ICA. 4. Distal small vessel atheromatous irregularity.  Echo: pending  ASSESSMENT AND PLAN:  Complete heart block: Regularized atrial  fibrillation with slow rate showing complete heart block.  Has symptoms of fatigue and weakness.  Duane Mercado plan for permanent pacemaker today.  Have discussed the risks and benefits of the procedure including risk to heart, lungs, bleeding and infection.  The patient understands the risks and wishes to proceed.  Duane Mercado place on right side as he is left handed. Atrial fibrillation: Unknown time course.  Duane Mercado need to be anticoagulated for CHADS2VASc of at least 2, possibly 5 or 6.  Duane Mercado use NOAC and start post pacemaker.  Duane Mercado discuss further options of rhythm vs rate control at follow up after he has been anticoagulated for 3 weeks.     Duane Mercado Meredith Leeds, MD 07/13/2015  8:21 AM

## 2015-07-13 NOTE — Progress Notes (Signed)
[facility] PROGRESS NOTE  Duane Mercado MEQ:[id number] DOB: 1940-11-23 DOA: 07/12/2015 PCP: [name], MD  HPI: Duane Mercado is a 74 yo male who presented to the ED on 07/12/2015 with right sided deficits and slurred speech. Per the patient, he had experienced generalized fatigue for several months, but on 10/4 he experienced persistent difficulty walking with right sided upper extremity weakness with decreased sensation in his fingertips. He went to bed that evening, but awoke several times with cold sweats and was  found to have asymmetrical smile and slurred speech on the morning of 10/5. In the ED, his pulse was found to be in the 30s and EKG demonstrated atrial flutter. MR brain was negative for stroke. He was admitted to the hospital for further workup.   PMH includes: Atrial flutter (originally on Coumadin, but stopped on his own and started 81 mg aspirin), asthma, bradycardia, right DVT   Subjective: Today the patient is doing well. He denies chest pain, palpitations, or SOB. No N/V/D or abdominal pain.   Assessment/Plan: Principal Problem:  TIA: Patient presented with right sided deficits and slurred speech, which completely resolved spontaneously. CT/MRI showed no acute intracranial abnormalities. MRA showed severe stenosis of right M2 without occlusion. 2D echocardiogram showed EF of 50-55% with no wall motion abnormalities and evidence of LVH. Carotid doppler showed 1-39% bilateral ICA stenosis. LDL 108 (see below) and HgbA1C 5.9. Neurology consulted- initiated 325 mg Aspirin QD and recommended use of anticoagulation post pacemaker placement. PT/PT/SLP consultations pending. Continue to monitor.   Active Problems:  Lower extremity pain, Right: Patient has a history of DVT (25 years ago), currently taking 81 mg Aspirin QD. Denies any current pain. Doppler study pending. Continue patient on 325 mg Aspirin and prophylactic Lovenox.   Complete heart block with atrial  fibrillation/atrial flutter: Patient has a history of atrial flutter, but discontinued Coumadin on his own and replaced it with 81 mg Aspirin. Electrophysiology consulted- scheduled for permanent pacemaker insertion today d/t complete heart block. Cardiology consulted- CHA2DS2-VASC score 2, will require anticoagulation x3 weeks before cardioversion (at least) but will not initiate until after pacemaker has been inserted. Continue Aspirin 325 mg QD and monitor with telemetry.   Hyperlipidemia: LDL 108, post TIA goal < 70. Initiated Lipitor 40, continue.   Hypertension: 163/71 on admission. Not managed with at-home medications. Continues to fluctuate today (highest: 189/71). Will initiate Lisinopril 20 mg QD with PRN Hydralazine orders. Continue to monitor.   Code Status: Full Family Communication: Family friend at bedside Disposition Plan: Home when workup complete  DVT Prophylaxis: Lovenox   Consultants:  Neurology   Cardiology   Electrophysiology   Procedures:  Echocardiogram   Pacemaker insertion (Scheduled: 07/13/2015)  Antibiotics:  None    Objective: Filed Vitals:   07/13/15 1045  BP: 172/75  Pulse:   Temp:   Resp:    No intake or output data in the 24 hours ending 07/13/15 1331 Filed Weights   07/12/15 0918  Weight: 105 kg (231 lb 7.7 oz)    Exam:   General:  Well-nourished male, laying in bed with no acute distress  Cardiovascular: RRR, no m/r/g. No peripheral edema  Respiratory: CTA b/l, no wheeze or crackles  Abdomen: Soft, non-tender, non-distended. +BS  Neuro: A&Ox3, no focal neurological deficits   Skin: No lesions or rashes   Data Reviewed: Basic Metabolic Panel:  Recent Labs Lab 07/12/15 0851  NA 140  K 4.0  CL 108  CO2 21*  GLUCOSE 111*  BUN 18  CREATININE 0.84  CALCIUM 9.0   Liver Function Tests:  Recent Labs Lab 07/12/15 0851  AST 33  ALT 26  ALKPHOS 70  BILITOT 1.0  PROT 6.4*  ALBUMIN 3.9   No results for  input(s): LIPASE, AMYLASE in the last 168 hours. No results for input(s): AMMONIA in the last 168 hours. CBC:  Recent Labs Lab 07/12/15 0851  WBC 5.6  NEUTROABS 4.0  HGB 14.7  HCT 42.5  MCV 92.2  PLT 151   Cardiac Enzymes:  Recent Labs Lab 07/12/15 1912  TROPONINI <0.03   BNP (last 3 results) No results for input(s): BNP in the last 8760 hours.  ProBNP (last 3 results) No results for input(s): PROBNP in the last 8760 hours.  CBG: No results for input(s): GLUCAP in the last 168 hours.  No results found for this or any previous visit (from the past 240 hour(s)).   Studies: Dg Chest 2 View  07/12/2015   CLINICAL DATA:  Weakness within the right upper and lower extremities. History of asthma.  EXAM: CHEST  2 VIEW  COMPARISON:  12/19/2010  FINDINGS: Enlarged cardiac silhouette and mediastinal contours. Bilateral infrahilar heterogeneous opacities are unchanged. No new focal airspace opacities. No pleural effusion or pneumothorax. No evidence of edema. No acute osseus abnormalities.  IMPRESSION: Cardiomegaly without definite acute cardiopulmonary disease.   Electronically Signed   By: Sandi Mariscal M.D.   On: 07/12/2015 18:43   Ct Head Wo Contrast  07/12/2015   CLINICAL DATA:  Concern for stroke. Unsteady gait. Right-sided weakness and numbness.  EXAM: CT HEAD WITHOUT CONTRAST  TECHNIQUE: Contiguous axial images were obtained from the base of the skull through the vertex without intravenous contrast.  COMPARISON:  None.  FINDINGS: Mild atrophy, typical for age. Negative for hydrocephalus. Mild chronic microvascular ischemic change in the white matter.  Negative for acute infarct.  Negative for hemorrhage or mass.  Normal calvarium. Mucosal edema in the paranasal sinuses. No air-fluid level. Mild vascular calcification  IMPRESSION: Mild chronic microvascular ischemic change. No acute intracranial abnormality.  Mucosal thickening in the paranasal sinuses.   Electronically Signed   By:  Franchot Gallo M.D.   On: 07/12/2015 10:40   [name] Head Wo Contrast  07/12/2015   CLINICAL DATA:  Initial evaluation for transient ischemic attack. Episode of generalized weakness, unsteady gait, tingling in right hand.  EXAM: MRA HEAD WITHOUT CONTRAST  TECHNIQUE: Angiographic images of the Circle of Willis were obtained using MRA technique without intravenous contrast.  COMPARISON:  Prior MRI and CT from earlier the same day.  FINDINGS: ANTERIOR CIRCULATION:  The distal cervical segments of the internal carotid arteries are patent with antegrade flow. The petrous segments are widely patent. Cavernous and supraclinoid left ICA well opacified. Cavernous right ICA patent. There is a moderate short-segment stenosis within the supraclinoid right ICA (series 3, image 83). Short segment mild stenosis within the mid right A1 segment. Left A1 segment widely patent. Anterior communicating artery and anterior cerebral arteries well opacified. M1 segments demonstrate mild irregularity without focal stenosis. There is a focal severe stenosis within proximal right M2 branch at the base of the sylvian fissure (series 303, image 7). Right MCA branches opacified but mildly attenuated as compared to the left. Left MCA branches the well opacified to their distal aspects. Mild distal branch atheromatous irregularity.  POSTERIOR CIRCULATION:  Left vertebral artery dominant and patent to the vertebrobasilar junction. The slightly diminutive right vertebral artery widely patent as well. Posterior inferior cerebellar  arteries patent bilaterally. Basilar artery well opacified. Superior cerebellar arteries patent bilaterally. Both posterior cerebral arteries arise from the basilar artery and are opacified to their distal aspects. There is moderate multi focal atheromatous irregularity within the P1 and P2 segments bilaterally, slightly worse on the left. A short segment moderate stenosis within the right P1/P2 segment. There is a more  focal moderate to severe stenosis within the mid left P2 segment (series 305, image 6).  No aneurysm or vascular malformation.  IMPRESSION: 1. Focal severe stenosis within proximal right M2 branch without occlusion. Right MCA branches are mildly attenuated as compared to the left. 2. Moderate multi focal atheromatous irregularity throughout the posterior cerebral arteries, with more focal moderate stenoses within the right P1/P2 segment and mid left P2 segment. 3. Short-segment moderate stenosis within the supraclinoid right ICA. 4. Distal small vessel atheromatous irregularity.   Electronically Signed   By: Jeannine Boga M.D.   On: 07/12/2015 22:23   Mr Brain Wo Contrast (neuro Protocol)  07/12/2015   CLINICAL DATA:  Generalized weakness beginning last night. Unsteady gait. Tingling in the right hand.  EXAM: MRI HEAD WITHOUT CONTRAST  TECHNIQUE: Multiplanar, multiecho pulse sequences of the brain and surrounding structures were obtained without intravenous contrast.  COMPARISON:  Head CT same day  FINDINGS: Diffusion imaging does not show any acute or subacute infarction. The brainstem and cerebellum are normal. The cerebral hemispheres show a few scattered foci of T2 and FLAIR signal within the white matter consistent with minimal small vessel change. No cortical or large vessel territory infarction. No mass lesion, hemorrhage, hydrocephalus or extra-axial collection. No pituitary mass. There is mucosal thickening of the paranasal sinuses. Major vessels at the base of the brain show flow.  IMPRESSION: No acute intracranial finding. Mild small vessel change of the cerebral hemispheric white matter.  Mucosal inflammation of the paranasal sinuses.   Electronically Signed   By: Nelson Chimes M.D.   On: 07/12/2015 12:12    Scheduled Meds: . aspirin EC  325 mg Oral Daily  . atorvastatin  20 mg Oral q1800  .  ceFAZolin (ANCEF) IV  2 g Intravenous On Call  . enoxaparin (LOVENOX) injection  40 mg  Subcutaneous Q24H  . gentamicin irrigation  80 mg Irrigation On Call   Continuous Infusions: . sodium chloride      Principal Problem:   TIA (transient ischemic attack) Active Problems:   Atrial fibrillation (Duane Mercado)   Bradycardia       Duane Mercado, Student-PA  [facility] If 7PM-7AM, please contact night-coverage at [email or web address], password [facility] 07/13/2015, 1:31 PM  LOS: 1 day    Attending MD note  Patient was seen, examined,treatment plan was discussed with the PA-S.  I have personally reviewed the clinical findings, lab, imaging studies and management of this patient in detail. I agree with the documentation, as recorded by the PA-S.   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 MRI brain neg. Has long standing hx of Afib-previously on coumadin. Afib associated with bradycardia-per cards note-has Complete heart block as well. Plans are for PPM placement-then starting Eliquis. Suspect home tomorrow if stable.   [facility]

## 2015-07-13 NOTE — Progress Notes (Signed)
  Echocardiogram 2D Echocardiogram has been performed.  Jennette Dubin 07/13/2015, 12:26 PM

## 2015-07-14 ENCOUNTER — Inpatient Hospital Stay (HOSPITAL_COMMUNITY): Payer: Medicare Other

## 2015-07-14 ENCOUNTER — Encounter (HOSPITAL_COMMUNITY): Payer: Self-pay | Admitting: Cardiology

## 2015-07-14 DIAGNOSIS — I482 Chronic atrial fibrillation: Secondary | ICD-10-CM

## 2015-07-14 DIAGNOSIS — I481 Persistent atrial fibrillation: Secondary | ICD-10-CM

## 2015-07-14 DIAGNOSIS — E785 Hyperlipidemia, unspecified: Secondary | ICD-10-CM

## 2015-07-14 LAB — URINE CULTURE

## 2015-07-14 MED ORDER — LISINOPRIL 20 MG PO TABS: 20.0000 mg | ORAL_TABLET | Freq: Every day | ORAL | Status: AC

## 2015-07-14 MED ORDER — ATORVASTATIN CALCIUM 20 MG PO TABS: 20.0000 mg | ORAL_TABLET | Freq: Every day | ORAL | Status: DC

## 2015-07-14 MED ORDER — ASPIRIN 325 MG PO TBEC: 325.0000 mg | DELAYED_RELEASE_TABLET | Freq: Every day | ORAL | Status: DC

## 2015-07-14 MED ORDER — ASPIRIN 325 MG PO TBEC: 325.0000 mg | DELAYED_RELEASE_TABLET | Freq: Every day | ORAL | Status: AC

## 2015-07-14 MED ORDER — APIXABAN 5 MG PO TABS: 5.0000 mg | ORAL_TABLET | Freq: Two times a day (BID) | ORAL | Status: AC

## 2015-07-14 MED ORDER — APIXABAN 5 MG PO TABS: 5.0000 mg | ORAL_TABLET | Freq: Two times a day (BID) | ORAL | Status: DC

## 2015-07-14 NOTE — Progress Notes (Signed)
SLP Cancellation Note  Patient Details Name: Duane Mercado MRN: [id number] DOB: 17-Apr-1941   Cancelled treatment:       Reason Eval/Treat Not Completed: SLP screened, no needs identified, will sign off   [name] 07/14/2015, 9:15 AM

## 2015-07-14 NOTE — Progress Notes (Signed)
ANTICOAGULATION CONSULT NOTE - Initial Consult  Pharmacy Consult for apixaban Indication: atrial fibrillation  No Known Allergies  Patient Measurements: Height: 6' 5.17" (196 cm) Weight: 231 lb 7.7 oz (105 kg) IBW/kg (Calculated) : 89.48  Vital Signs: Temp: 97.9 F (36.6 C) (10/07 0520) Temp Source: Oral (10/07 0520) BP: 166/94 mmHg (10/07 0644) Pulse Rate: 60 (10/07 0644)  Labs:  Recent Labs  07/12/15 0851 07/12/15 1912  HGB 14.7  --   HCT 42.5  --   PLT 151  --   APTT 28  --   LABPROT 14.9  --   INR 1.15  --   CREATININE 0.84  --   TROPONINI  --  <0.03    Estimated Creatinine Clearance: 96.2 mL/min (by C-G formula based on Cr of 0.84).   Medical History: Past Medical History  Diagnosis Date  . Asthma     w seasonal rhinitis  . Sigmoid diverticulosis   . Hx of cardiovascular stress test     coronary artery calcifications with normal nuclear study in 2010  . Tubulovillous adenoma polyp of colon   . Coronary artery calcification seen on CAT scan     nomal nuclear stress test  . Paroxysmal atrial flutter (HCC)      Assessment: 74 yom not on anticoagulation pta presenting with L-sided weakness. Now to start on apixaban post-discharge on 07/17/15 for afib. Pharmacy consulted to dose and provide education prior to discharge today. Patient on lovenox sq for dvt ppx inpatient. CBC wnl. No bleed documented.  Age<80, wt>60kg, SCr<1.5  Goal of Therapy:  Stroke prevention Monitor platelets by anticoagulation protocol: Yes   Plan:  Apixaban 5mg  po bid to start on 07/17/15 Monitor CBC periodically Monitor s/sx bleeding Pharmacy to provide discharge education  Elicia Lamp, PharmD Clinical Pharmacist Pager [phone] 07/14/2015 9:53 AM

## 2015-07-14 NOTE — Discharge Instructions (Signed)
Please stop aspirin once you have started Eliquis    You have an appointment set up with the [facility].  Multiple studies have shown that being followed by a dedicated atrial fibrillation clinic in addition to the standard care you receive from your other physicians improves health. We believe that enrollment in the atrial fibrillation clinic will allow [place] to better care for you.   The phone number to the [facility] is [phone]. The clinic is staffed Monday through Friday from 8:30am to 5pm.  Parking Directions: The clinic is located in the Heart and Vascular Building connected to [facility]. 1)From [address] turn on to [address] and go to the 3rd entrance  (Heart and Vascular entrance) on the right. 2)Look to the right for Heart &Vascular Parking Garage. 3)A code for the entrance is required please call the clinic to receive this.   4)Take the elevators to the 1st floor. Registration is in the room with the glass walls at the end of the hallway.  If you have any trouble parking or locating the clinic, please dont hesitate to call [phone].     Supplemental Discharge Instructions for  Pacemaker/Defibrillator Patients  Activity No heavy lifting or vigorous activity with your left/right arm for 6 to 8 weeks.  Do not raise your left/right arm above your head for one week.  Gradually raise your affected arm as drawn below.           __     07-18-15                 07-19-15               07-20-15              07-21-15  NO DRIVING for  1 week   ; you may begin driving on   67-12-45  .  WOUND CARE - Keep the wound area clean and dry.  Do not get this area wet for one week. No showers for one week; you may shower on   07-21-15  . - The tape/steri-strips on your wound will fall off; do not pull them off.  No bandage is needed on the site.  DO  NOT apply any creams, oils, or ointments to the wound area. - If you notice any drainage  or discharge from the wound, any swelling or bruising at the site, or you develop a fever > 101? F after you are discharged home, call the office at once.  Special Instructions - You are still able to use cellular telephones; use the ear opposite the side where you have your pacemaker/defibrillator.  Avoid carrying your cellular phone near your device. - When traveling through airports, show security personnel your identification card to avoid being screened in the metal detectors.  Ask the security personnel to use the hand wand. - Avoid arc welding equipment, MRI testing (magnetic resonance imaging), TENS units (transcutaneous nerve stimulators).  Call the office for questions about other devices. - Avoid electrical appliances that are in poor condition or are not properly grounded. - Microwave ovens are safe to be near or to operate.   Information on my medicine - ELIQUIS (apixaban)  This medication education was reviewed with me or my healthcare representative as part of my discharge preparation.  The pharmacist that spoke with me during my hospital stay was:  Romona Curls, [facility]  Why was Eliquis prescribed for you? Eliquis was prescribed for you  to reduce the risk of a blood clot forming that can cause a stroke if you have a medical condition called atrial fibrillation (a type of irregular heartbeat).  What do You need to know about Eliquis ? Take your Eliquis TWICE DAILY - one tablet in the morning and one tablet in the evening with or without food. If you have difficulty swallowing the tablet whole please discuss with your pharmacist how to take the medication safely.  Take Eliquis exactly as prescribed by your doctor and DO NOT stop taking Eliquis without talking to the doctor who prescribed the medication.  Stopping may increase your risk of developing a stroke.  Refill your prescription before you run out.  After discharge, you should have regular check-up appointments with your  healthcare provider that is prescribing your Eliquis.  In the future your dose may need to be changed if your kidney function or weight changes by a significant amount or as you get older.  What do you do if you miss a dose? If you miss a dose, take it as soon as you remember on the same day and resume taking twice daily.  Do not take more than one dose of ELIQUIS at the same time to make up a missed dose.  Important Safety Information A possible side effect of Eliquis is bleeding. You should call your healthcare provider right away if you experience any of the following: ? Bleeding from an injury or your nose that does not stop. ? Unusual colored urine (red or dark brown) or unusual colored stools (red or black). ? Unusual bruising for unknown reasons. ? A serious fall or if you hit your head (even if there is no bleeding).  Some medicines may interact with Eliquis and might increase your risk of bleeding or clotting while on Eliquis. To help avoid this, consult your healthcare provider or pharmacist prior to using any new prescription or non-prescription medications, including herbals, vitamins, non-steroidal anti-inflammatory drugs (NSAIDs) and supplements.  This website has more information on Eliquis (apixaban): http://www.eliquis.com/eliquis/home

## 2015-07-14 NOTE — Progress Notes (Signed)
OT Note  Patient Details Name: Duane Mercado MRN: [id number] DOB: 08-Sep-1941   Cancelled Treatment:    Reason Eval/Treat Not Completed: OT screened, no needs identified, will sign off (Patient  is reportedly at baseline and all symptoms have resolved per nurse and PT).  Early, Leila A 07/14/2015, 11:54 AM

## 2015-07-14 NOTE — Care Management Important Message (Addendum)
Important Message  Patient Details  Name: Duane Mercado MRN: [id number] Date of Birth: 1940/12/24   Medicare Important Message Given:  N/A - LOS <3 / Initial given by admissions    Dawayne Patricia, RN 07/14/2015, 9:57 AM

## 2015-07-14 NOTE — Progress Notes (Signed)
SUBJECTIVE: The patient is doing well today.  At this time, he denies chest pain, shortness of breath, or any new concerns.  S/p PPM implant 07-13-15  CURRENT MEDICATIONS: . aspirin EC  325 mg Oral Daily  . atorvastatin  20 mg Oral q1800  .  ceFAZolin (ANCEF) IV  1 g Intravenous Q6H  . enoxaparin (LOVENOX) injection  40 mg Subcutaneous Q24H  . lisinopril  20 mg Oral Daily      OBJECTIVE: Physical Exam: Filed Vitals:   07/13/15 1750 07/13/15 2021 07/14/15 0520 07/14/15 0644  BP: 164/94 137/65 172/96 166/94  Pulse: 59 60 60 60  Temp:  98.4 F (36.9 C) 97.9 F (36.6 C)   TempSrc:  Oral Oral   Resp: 18 18 18 18   Height:      Weight:      SpO2:  99% 96% 98%    Intake/Output Summary (Last 24 hours) at 07/14/15 0726 Last data filed at 07/14/15 0520  Gross per 24 hour  Intake    480 ml  Output    775 ml  Net   -295 ml    Telemetry reveals atrial fibrillation with v pacing  GEN- The patient is well appearing, alert and oriented x 3 today.   Head- normocephalic, atraumatic Eyes-  Sclera clear, conjunctiva pink Ears- hearing intact Oropharynx- clear Neck- supple  Lungs- Clear to ausculation bilaterally, normal work of breathing Heart- Regular rate and rhythm  GI- soft, NT, ND, + BS Extremities- no clubbing, cyanosis, or edema Skin- no rash or lesion, right chest without hematoma/ecchymosis Psych- euthymic mood, full affect Neuro- strength and sensation are intact  LABS: Basic Metabolic Panel:  Recent Labs  07/12/15 0851  NA 140  K 4.0  CL 108  CO2 21*  GLUCOSE 111*  BUN 18  CREATININE 0.84  CALCIUM 9.0   Liver Function Tests:  Recent Labs  07/12/15 0851  AST 33  ALT 26  ALKPHOS 70  BILITOT 1.0  PROT 6.4*  ALBUMIN 3.9  CBC:  Recent Labs  07/12/15 0851  WBC 5.6  NEUTROABS 4.0  HGB 14.7  HCT 42.5  MCV 92.2  PLT 151   Cardiac Enzymes:  Recent Labs  07/12/15 1912  TROPONINI <0.03   Hemoglobin A1C:  Recent Labs  07/12/15 0830    HGBA1C 5.9*   Fasting Lipid Panel:  Recent Labs  07/13/15 0704  CHOL 167  HDL 46  LDLCALC 108*  TRIG 64  CHOLHDL 3.6    RADIOLOGY: Dg Chest 2 View 07/12/2015   CLINICAL DATA:  Weakness within the right upper and lower extremities. History of asthma.  EXAM: CHEST  2 VIEW  COMPARISON:  12/19/2010  FINDINGS: Enlarged cardiac silhouette and mediastinal contours. Bilateral infrahilar heterogeneous opacities are unchanged. No new focal airspace opacities. No pleural effusion or pneumothorax. No evidence of edema. No acute osseus abnormalities.  IMPRESSION: Cardiomegaly without definite acute cardiopulmonary disease.   Electronically Signed   By: Sandi Mariscal M.D.   On: 07/12/2015 18:43   ASSESSMENT AND PLAN:  Principal Problem:   TIA (transient ischemic attack) Active Problems:   Atrial fibrillation (HCC)   Bradycardia   Complete heart block ([place])   1.  Complete heart block S/p PPM implant 07-13-15 CXR this morning reviewed - no obvious ptx, lateral view looks like atrial lead in position - Dr Curt Bears to review Device interrogation reviewed, some atrial undersensing ([place] Able need to re-evaluate once in SR). Programmed DDIR for now  2.  Persistent  atrial fibrillation CHADS2VASC score is 4 Would start [place] once pacemaker pocket is stable (likely Monday) - Dr Curt Bears to see today Follow up in AF clinic in 4 weeks to discuss DCCV  3.  OSA Compliance with CPAP Shawnie Nicole help with atrial arrhythmias  4.  TIA Per neurology  Ok from an EP standpoint to DC home after seen by Dr Curt Bears. Routine wound care and follow-up.  Instructions and appts entered in AVS Electrophysiology team to see as needed while here. Please call with questions.  Chanetta Marshall, NP 07/14/2015 7:31 AM  I have seen and examined this patient with Chanetta Marshall.  Agree with above, note added to reflect my findings.  On exam, regular rhythm, no murmurs, lungs clear.  Had dual chamber pacemaker placed yesterday for complete  heart block.  Also has atrial fibrillation.  Dual chamber pacemaker stable overnight.  Plan to start anticoagulation Monday for atrial fibrillation and follow up in AF clinic for possible DCCV.  Kaleab Frasier plan to evaluate atrial lead when back in sinus rhythm.    Osher Oettinger M. Nyla Creason MD 07/14/2015 8:19 AM

## 2015-07-14 NOTE — Evaluation (Signed)
Physical Therapy Evaluation and Discharge Patient Details Name: Duane Mercado MRN: [id number] DOB: Mar 22, 1941 Today's Date: 07/14/2015   History of Present Illness  Pt is a 74 y/o male with a PMH significant for paroxysmal a-flutter, coronary artery calcification. He presented to the ED on 07/12/15 with R-sided deficits (mainly UE), asymmetrical smile and slurred speech. MRI neg for stroke but pulse was found to be in the 30's with a-flutter. Pt is now s/p PPM placement on 07/13/15.  Clinical Impression  Patient evaluated by Physical Therapy with no further acute PT needs identified. All education has been completed and the patient has no further questions. At the time of PT eval pt was able to perform transfers and ambulation with modified independence. Pt's son present during session and remarks that pt appears to be walking better now than he has in months. Pt denies any lingering symptoms in RUE (states sensation is equal to L), fine motor slightly decreased on the R however pt is L handed, and again pt states this is his baseline. Feel pt may benefit from outpatient therapy for general strengthening and balance training. See below for any follow-up Physial Therapy or equipment needs. PT is signing off. Thank you for this referral.     Follow Up Recommendations Outpatient PT;Supervision - Intermittent     Equipment Recommendations  None recommended by PT    Recommendations for Other Services       Precautions / Restrictions Precautions Precautions: Fall;ICD/Pacemaker Restrictions Weight Bearing Restrictions: No      Mobility  Bed Mobility Overal bed mobility: Independent                Transfers Overall transfer level: Modified independent Equipment used: None             General transfer comment: Increased time  Ambulation/Gait Ambulation/Gait assistance: Modified independent (Device/Increase time) Ambulation Distance (Feet): 400 Feet Assistive device: None Gait  Pattern/deviations: WFL(Within Functional Limits) Gait velocity: Decreased Gait velocity interpretation: Below normal speed for age/gender General Gait Details: Noted slightly decreased DF in R ankle during mobility, however able to be corrected with cues and pt reports this may be baseline as he has prior "issues" with R leg from a patellar fracture.   Stairs Stairs: Yes Stairs assistance: Supervision Stair Management: One rail Right Number of Stairs: 10 General stair comments: Cued pt for step-to pattern for safety.   Wheelchair Mobility    Modified Rankin (Stroke Patients Only) Modified Rankin (Stroke Patients Only) Pre-Morbid Rankin Score: No symptoms Modified Rankin: No symptoms     Balance Overall balance assessment: Modified Independent                                           Pertinent Vitals/Pain Pain Assessment: No/denies pain    Home Living Family/patient expects to be discharged to:: Private residence Living Arrangements: Other relatives Available Help at Discharge: Family;Available 24 hours/day Type of Home: House Home Access: Stairs to enter Entrance Stairs-Rails: None Entrance Stairs-Number of Steps: 3 Home Layout: Two level Home Equipment: Cane - single point      Prior Function Level of Independence: Independent         Comments: Pt is a Nutritional therapist [employer]   Dominant Hand: Left    Extremity/Trunk Assessment   Upper Extremity Assessment: Overall WFL for tasks assessed  Lower Extremity Assessment: Overall WFL for tasks assessed      Cervical / Trunk Assessment: Normal  Communication   Communication: No difficulties  Cognition Arousal/Alertness: Awake/alert Behavior During Therapy: WFL for tasks assessed/performed Overall Cognitive Status: Within Functional Limits for tasks assessed                      General Comments      Exercises        Assessment/Plan    PT Assessment  Patent does not need any further PT services  PT Diagnosis Abnormality of gait   PT Problem List    PT Treatment Interventions     PT Goals (Current goals can be found in the Care Plan section) Acute Rehab PT Goals PT Goal Formulation: All assessment and education complete, DC therapy    Frequency     Barriers to discharge        Co-evaluation               End of Session Equipment Utilized During Treatment: Gait belt Activity Tolerance: Patient tolerated treatment well Patient left: with call bell/phone within reach;with family/visitor present (Sitting EOB) Nurse Communication: Mobility status         Time: 7482-7078 PT Time Calculation (min) (ACUTE ONLY): 30 min   Charges:   PT Evaluation $Initial PT Evaluation Tier I: 1 Procedure PT Treatments $Gait Training: 8-22 mins   PT G Codes:        Rolinda Roan 07-18-2015, 12:27 PM   Rolinda Roan, PT, DPT Acute Rehabilitation Services Pager: [phone]

## 2015-07-14 NOTE — Discharge Summary (Signed)
PATIENT DETAILS Name: Duane Mercado Age: 74 y.o. Sex: male Date of Birth: [birth year] MRN: [id number]. Admitting Physician: Reyne Dumas, MD FWY:OVZCHYI,FOYDXA K, MD  Admit Date: 07/12/2015 Discharge date: 07/14/2015  Recommendations for Outpatient Follow-up:  1. Start taking Eliquis from 10/10  2. Ensure follow-up with cardiology/EPS  3. Ensure follow-up with neurology-Dr. Leonie Man.  4. Repeat lipid panel and neck 3 months  PRIMARY DISCHARGE DIAGNOSIS:  Principal Problem:   TIA (transient ischemic attack) Active Problems:   Atrial fibrillation (HCC)   Bradycardia   Complete heart block (HCC)      PAST MEDICAL HISTORY: Past Medical History  Diagnosis Date  . Asthma     w seasonal rhinitis  . Sigmoid diverticulosis   . Hx of cardiovascular stress test     coronary artery calcifications with normal nuclear study in 2010  . Tubulovillous adenoma polyp of colon   . Coronary artery calcification seen on CAT scan     nomal nuclear stress test  . Paroxysmal atrial flutter (HCC)     DISCHARGE MEDICATIONS: Current Discharge Medication List    START taking these medications   Details  apixaban (ELIQUIS) 5 MG TABS tablet Take 1 tablet (5 mg total) by mouth 2 (two) times daily. Start on Monday-07/17/15 Qty: 120 tablet, Refills: 0    aspirin EC 325 MG EC tablet Take 1 tablet (325 mg total) by mouth daily. Stop taking once you have started taking Eliquis Qty: 5 tablet, Refills: 0    atorvastatin (LIPITOR) 20 MG tablet Take 1 tablet (20 mg total) by mouth daily at 6 PM. Qty: 90 tablet, Refills: 0    lisinopril (PRINIVIL,ZESTRIL) 20 MG tablet Take 1 tablet (20 mg total) by mouth daily. Qty: 90 tablet, Refills: 0      CONTINUE these medications which have NOT CHANGED   Details  Multiple Vitamin (MULTIVITAMIN WITH MINERALS) TABS tablet Take 1 tablet by mouth daily.      STOP taking these medications     aspirin 81 MG tablet         ALLERGIES:  No Known  Allergies  BRIEF HPI:  See H&P, Labs, Consult and Test reports for all details in brief, patient was admitted for evaluation of speech difficulties. Please see below for further details   CONSULTATIONS:   cardiology and neurology  PERTINENT RADIOLOGIC STUDIES: Dg Chest 2 View  07/14/2015   CLINICAL DATA:  Cardiac arrhythmia, status post pacemaker placement  EXAM: CHEST  2 VIEW  COMPARISON:  July 12, 2015  FINDINGS: There is no a pacemaker on the right with lead tips attached to the right atrium and great cardiac vein. No pneumothorax. There is atelectatic change in the left base. Lungs elsewhere clear. Heart is upper normal in size with pulmonary vascularity within normal limits. No adenopathy. There is degenerative change in the thoracic spine.  IMPRESSION: There is no a pacemaker present with leads attached to the right atrium and great cardiac vein. No pneumothorax. Left base atelectasis. No edema or consolidation.   Electronically Signed   By: Lowella Grip III M.D.   On: 07/14/2015 07:57   Dg Chest 2 View  07/12/2015   CLINICAL DATA:  Weakness within the right upper and lower extremities. History of asthma.  EXAM: CHEST  2 VIEW  COMPARISON:  12/19/2010  FINDINGS: Enlarged cardiac silhouette and mediastinal contours. Bilateral infrahilar heterogeneous opacities are unchanged. No new focal airspace opacities. No pleural effusion or pneumothorax. No evidence of edema. No acute osseus abnormalities.  IMPRESSION: Cardiomegaly without definite acute cardiopulmonary disease.   Electronically Signed   By: Sandi Mariscal M.D.   On: 07/12/2015 18:43   Ct Head Wo Contrast  07/12/2015   CLINICAL DATA:  Concern for stroke. Unsteady gait. Right-sided weakness and numbness.  EXAM: CT HEAD WITHOUT CONTRAST  TECHNIQUE: Contiguous axial images were obtained from the base of the skull through the vertex without intravenous contrast.  COMPARISON:  None.  FINDINGS: Mild atrophy, typical for age. Negative for  hydrocephalus. Mild chronic microvascular ischemic change in the white matter.  Negative for acute infarct.  Negative for hemorrhage or mass.  Normal calvarium. Mucosal edema in the paranasal sinuses. No air-fluid level. Mild vascular calcification  IMPRESSION: Mild chronic microvascular ischemic change. No acute intracranial abnormality.  Mucosal thickening in the paranasal sinuses.   Electronically Signed   By: Franchot Gallo M.D.   On: 07/12/2015 10:40   [name] Head Wo Contrast  07/12/2015   CLINICAL DATA:  Initial evaluation for transient ischemic attack. Episode of generalized weakness, unsteady gait, tingling in right hand.  EXAM: MRA HEAD WITHOUT CONTRAST  TECHNIQUE: Angiographic images of the Circle of Willis were obtained using MRA technique without intravenous contrast.  COMPARISON:  Prior MRI and CT from earlier the same day.  FINDINGS: ANTERIOR CIRCULATION:  The distal cervical segments of the internal carotid arteries are patent with antegrade flow. The petrous segments are widely patent. Cavernous and supraclinoid left ICA well opacified. Cavernous right ICA patent. There is a moderate short-segment stenosis within the supraclinoid right ICA (series 3, image 83). Short segment mild stenosis within the mid right A1 segment. Left A1 segment widely patent. Anterior communicating artery and anterior cerebral arteries well opacified. M1 segments demonstrate mild irregularity without focal stenosis. There is a focal severe stenosis within proximal right M2 branch at the base of the sylvian fissure (series 303, image 7). Right MCA branches opacified but mildly attenuated as compared to the left. Left MCA branches the well opacified to their distal aspects. Mild distal branch atheromatous irregularity.  POSTERIOR CIRCULATION:  Left vertebral artery dominant and patent to the vertebrobasilar junction. The slightly diminutive right vertebral artery widely patent as well. Posterior inferior cerebellar arteries  patent bilaterally. Basilar artery well opacified. Superior cerebellar arteries patent bilaterally. Both posterior cerebral arteries arise from the basilar artery and are opacified to their distal aspects. There is moderate multi focal atheromatous irregularity within the P1 and P2 segments bilaterally, slightly worse on the left. A short segment moderate stenosis within the right P1/P2 segment. There is a more focal moderate to severe stenosis within the mid left P2 segment (series 305, image 6).  No aneurysm or vascular malformation.  IMPRESSION: 1. Focal severe stenosis within proximal right M2 branch without occlusion. Right MCA branches are mildly attenuated as compared to the left. 2. Moderate multi focal atheromatous irregularity throughout the posterior cerebral arteries, with more focal moderate stenoses within the right P1/P2 segment and mid left P2 segment. 3. Short-segment moderate stenosis within the supraclinoid right ICA. 4. Distal small vessel atheromatous irregularity.   Electronically Signed   By: Jeannine Boga M.D.   On: 07/12/2015 22:23   Mr Brain Wo Contrast (neuro Protocol)  07/12/2015   CLINICAL DATA:  Generalized weakness beginning last night. Unsteady gait. Tingling in the right hand.  EXAM: MRI HEAD WITHOUT CONTRAST  TECHNIQUE: Multiplanar, multiecho pulse sequences of the brain and surrounding structures were obtained without intravenous contrast.  COMPARISON:  Head CT same day  FINDINGS: Diffusion imaging does not show any acute or subacute infarction. The brainstem and cerebellum are normal. The cerebral hemispheres show a few scattered foci of T2 and FLAIR signal within the white matter consistent with minimal small vessel change. No cortical or large vessel territory infarction. No mass lesion, hemorrhage, hydrocephalus or extra-axial collection. No pituitary mass. There is mucosal thickening of the paranasal sinuses. Major vessels at the base of the brain show flow.   IMPRESSION: No acute intracranial finding. Mild small vessel change of the cerebral hemispheric white matter.  Mucosal inflammation of the paranasal sinuses.   Electronically Signed   By: Nelson Chimes M.D.   On: 07/12/2015 12:12     PERTINENT LAB RESULTS: CBC:  Recent Labs  07/12/15 0851  WBC 5.6  HGB 14.7  HCT 42.5  PLT 151   CMET CMP     Component Value Date/Time   NA 140 07/12/2015 0851   K 4.0 07/12/2015 0851   CL 108 07/12/2015 0851   CO2 21* 07/12/2015 0851   GLUCOSE 111* 07/12/2015 0851   BUN 18 07/12/2015 0851   CREATININE 0.84 07/12/2015 0851   CALCIUM 9.0 07/12/2015 0851   PROT 6.4* 07/12/2015 0851   ALBUMIN 3.9 07/12/2015 0851   AST 33 07/12/2015 0851   ALT 26 07/12/2015 0851   ALKPHOS 70 07/12/2015 0851   BILITOT 1.0 07/12/2015 0851   GFRNONAA >60 07/12/2015 0851   GFRAA >60 07/12/2015 0851    GFR Estimated Creatinine Clearance: 96.2 mL/min (by C-G formula based on Cr of 0.84). No results for input(s): LIPASE, AMYLASE in the last 72 hours.  Recent Labs  07/12/15 1912  TROPONINI <0.03   Invalid input(s): POCBNP No results for input(s): DDIMER in the last 72 hours.  Recent Labs  07/12/15 0830  HGBA1C 5.9*    Recent Labs  07/13/15 0704  CHOL 167  HDL 46  LDLCALC 108*  TRIG 64  CHOLHDL 3.6   No results for input(s): TSH, T4TOTAL, T3FREE, THYROIDAB in the last 72 hours.  Invalid input(s): FREET3 No results for input(s): VITAMINB12, FOLATE, FERRITIN, TIBC, IRON, RETICCTPCT in the last 72 hours. Coags:  Recent Labs  07/12/15 0851  INR 1.15   Microbiology: No results found for this or any previous visit (from the past 240 hour(s)).   BRIEF HOSPITAL COURSE:  TIA: Patient presented with right sided deficits and slurred speech, which completely resolved spontaneously. CT/MRI showed no acute intracranial abnormalities. MRA showed severe stenosis of right M2 without occlusion. 2D echocardiogram showed EF of 50-55% with no wall motion  abnormalities and evidence of LVH. Carotid doppler showed 1-39% bilateral ICA stenosis. LDL 108 (see below) and HgbA1C 5.9. Neurology consulted- initiated 325 mg Aspirin QD and recommended use of anticoagulation post pacemaker placement-starting on 07/17/15. Patient instructed to stop taking aspirin once he has  started on Eliquis.   Complete heart block with atrial fibrillation/atrial flutter: Patient has a history of atrial flutter, but discontinued Coumadin on his own and replaced it with 81 mg Aspirin. Electrophysiology consulted- underwent permanent pacemaker insertion on 10/6 d/t complete heart block. CHA2DS2-VASC score 2, will require anticoagulation starting on 10/10 per EP. Patient will follow-up with the atrial fibrillation clinic for further rate control/rhythm control issues.  Hyperlipidemia: LDL 108, post TIA goal < 70. Initiated Lipitor 40, continue on discharge  Essential hypertension: Started on lisinopril-please continue to optimize in the outpatient setting  TODAY-DAY OF DISCHARGE:  Subjective:   Mathews Robinsons today has no headache,no chest abdominal pain,no  new weakness tingling or numbness, feels much better wants to go home today.   Objective:   Blood pressure 166/94, pulse 60, temperature 97.9 F (36.6 C), temperature source Oral, resp. rate 18, height 6' 5.17" (1.96 m), weight 105 kg (231 lb 7.7 oz), SpO2 98 %.  Intake/Output Summary (Last 24 hours) at 07/14/15 0952 Last data filed at 07/14/15 0520  Gross per 24 hour  Intake    480 ml  Output    775 ml  Net   -295 ml   Filed Weights   07/12/15 0918  Weight: 105 kg (231 lb 7.7 oz)    Exam Awake Alert, Oriented *3, No new F.N deficits, Normal affect [place].AT,PERRAL Supple Neck,No JVD, No cervical lymphadenopathy appriciated.  Symmetrical Chest wall movement, Good air movement bilaterally, CTAB RRR,No Gallops,Rubs or new Murmurs, No Parasternal Heave +ve B.Sounds, Abd Soft, Non tender, No organomegaly appriciated,  No rebound -guarding or rigidity. No Cyanosis, Clubbing or edema, No new Rash or bruise  DISCHARGE CONDITION: Stable  DISPOSITION: Home  DISCHARGE INSTRUCTIONS:    Activity:  As tolerated   Get Medicines reviewed and adjusted: Please take all your medications with you for your next visit with your Primary MD  Please request your Primary MD to go over all hospital tests and procedure/radiological results at the follow up, please ask your Primary MD to get all Hospital records sent to his/her office.  If you experience worsening of your admission symptoms, develop shortness of breath, life threatening emergency, suicidal or homicidal thoughts you must seek medical attention immediately by calling 911 or calling your MD immediately  if symptoms less severe.  You must read complete instructions/literature along with all the possible adverse reactions/side effects for all the Medicines you take and that have been prescribed to you. Take any new Medicines after you have completely understood and accpet all the possible adverse reactions/side effects.   Do not drive when taking Pain medications.   Do not take more than prescribed Pain, Sleep and Anxiety Medications  Special Instructions: If you have smoked or chewed Tobacco  in the last 2 yrs please stop smoking, stop any regular Alcohol  and or any Recreational drug use.  Wear Seat belts while driving.  Please note  You were cared for by a hospitalist during your hospital stay. Once you are discharged, your primary care physician will handle any further medical issues. Please note that NO REFILLS for any discharge medications will be authorized once you are discharged, as it is imperative that you return to your primary care physician (or establish a relationship with a primary care physician if you do not have one) for your aftercare needs so that they can reassess your need for medications and monitor your lab values.  Diet  recommendation: Heart Healthy diet  Discharge Instructions    Ambulatory referral to Neurology    Complete by:  As directed   Please schedule post stroke follow up in 2 months.     Call MD for:  persistant dizziness or light-headedness    Complete by:  As directed      Call MD for:  persistant nausea and vomiting    Complete by:  As directed      Call MD for:  redness, tenderness, or signs of infection (pain, swelling, redness, odor or green/yellow discharge around incision site)    Complete by:  As directed      Diet - low sodium heart healthy    Complete by:  As directed      Increase activity slowly    Complete by:  As directed            Follow-up Information    Follow up with [facility] On 07/26/2015.   Why:  at 10:30AM for wound check   Contact information:   [place] [address] [place] [address]       Follow up with [facility] On 08/18/2015.   Why:  at [facility] information:   [address] [place] [phone]      Follow up with Will Meredith Leeds, MD On 10/13/2015.   Specialty:  Cardiology   Why:  at [facility] information:   [place] [address] [phone]       Follow up with SETHI,PRAMOD, MD In 2 months.   Specialties:  Neurology, Radiology   Why:  Stroke Clinic, Office will call you with appointment date & time   Contact information:   [place] [address] [phone]       Follow up with [name], MD. Schedule an appointment as soon as possible for a visit in 2 weeks.   Specialty:  Gastroenterology   Contact information:   [address] [place] [address] [phone]       Total Time spent on discharge equals  45 minutes.  SignedOren Binet 07/14/2015 9:52 AM

## 2015-07-14 NOTE — Care Management Important Message (Signed)
Important Message  Patient Details  Name: Duane Mercado MRN: [id number] Date of Birth: 1940-10-10   Medicare Important Message Given:  Yes-second notification given    Nathen May 07/14/2015, 12:01 PM

## 2015-07-14 NOTE — Progress Notes (Signed)
07/14/2015 1:20 PM Discharge AVS meds taken today and those due this evening reviewed.  Follow-up appointments and when to call md reviewed.  Specific instructions on Pacemaker discussed.  D/C IV and TELE.  Questions and concerns addressed.   D/C home per orders. [place]

## 2015-07-14 NOTE — Progress Notes (Signed)
STROKE TEAM PROGRESS NOTE   SUBJECTIVE (INTERVAL HISTORY) Patient sitting up at the bedside. No complaints. He states he feels back to normal.   OBJECTIVE Temp:  [97.9 F (36.6 C)-98.4 F (36.9 C)] 97.9 F (36.6 C) (10/07 0520) Pulse Rate:  [26-153] 60 (10/07 0644) Cardiac Rhythm:  [-] Ventricular paced (10/07 0644) Resp:  [8-29] 18 (10/07 0644) BP: (137-193)/(65-115) 166/94 mmHg (10/07 0644) SpO2:  [0 %-100 %] 98 % (10/07 0644)  CBC:   Recent Labs Lab 07/12/15 0851  WBC 5.6  NEUTROABS 4.0  HGB 14.7  HCT 42.5  MCV 92.2  PLT 808   Basic Metabolic Panel:   Recent Labs Lab 07/12/15 0851  NA 140  K 4.0  CL 108  CO2 21*  GLUCOSE 111*  BUN 18  CREATININE 0.84  CALCIUM 9.0   Lipid Panel:     Component Value Date/Time   CHOL 167 07/13/2015 0704   TRIG 64 07/13/2015 0704   HDL 46 07/13/2015 0704   CHOLHDL 3.6 07/13/2015 0704   VLDL 13 07/13/2015 0704   LDLCALC 108* 07/13/2015 0704   HgbA1c:  Lab Results  Component Value Date   HGBA1C 5.9* 07/12/2015   Urine Drug Screen:     Component Value Date/Time   LABOPIA NONE DETECTED 07/13/2015 0227   COCAINSCRNUR NONE DETECTED 07/13/2015 0227   LABBENZ NONE DETECTED 07/13/2015 0227   AMPHETMU NONE DETECTED 07/13/2015 0227   THCU NONE DETECTED 07/13/2015 0227   LABBARB NONE DETECTED 07/13/2015 0227      IMAGING  Dg Chest 2 View 07/12/2015   Cardiomegaly without definite acute cardiopulmonary disease.     Ct Head Wo Contrast 07/12/2015    Mild chronic microvascular ischemic change. No acute intracranial abnormality.  Mucosal thickening in the paranasal sinuses.     Mr Duane Mercado Head Wo Contrast 07/12/2015    1. Focal severe stenosis within proximal right M2 branch without occlusion. Right MCA branches are mildly attenuated as compared to the left. 2. Moderate multi focal atheromatous irregularity throughout the posterior cerebral arteries, with more focal moderate stenoses within the right P1/P2 segment and mid left  P2 segment. 3. Short-segment moderate stenosis within the supraclinoid right ICA. 4. Distal small vessel atheromatous irregularity.     Mr Brain Wo Contrast (neuro Protocol) 07/12/2015    No acute intracranial finding. Mild small vessel change of the cerebral hemispheric white matter.  Mucosal inflammation of the paranasal sinuses.     Lower extremity venous duplex  No evidence of DVT, superficial thrombosis, or Baker's Cyst.  Carotid Doppler   There is 1-39% bilateral ICA stenosis. Vertebral artery flow is antegrade.    2D Echocardiogram   - Left ventricle: The cavity size was normal. Wall thickness wasincreased in a pattern of moderate LVH. Systolic function wasnormal. The estimated ejection fraction was in the range of 50%to 55%. Wall motion was normal; there were no regional wallmotion abnormalities. - Aortic valve: There was mild regurgitation. - Left atrium: The atrium was moderately dilated. - Right atrium: The atrium was moderately dilated.   PHYSICAL EXAM Pleasant elderly Caucasian male not in distress. . Afebrile. Head is nontraumatic. Neck is supple without bruit.    Cardiac exam no murmur or gallop. Lungs are clear to auscultation. Distal pulses are well felt. Neurological Exam :  Awake alert oriented 3 with normal speech and language function. No aphasia or apraxia dysarthria. Extraocular movements are full range without nystagmus. Fundi were not visualized. Vision acuity and fields seem adequate. Face is  symmetric without weakness. Tongue is midline. Motor system exam no upper or lower extremity drift but weakness of right grip and intrinsic hand muscles. Orbits left over right upper extremity. Symmetric lower extremity strength. Reflexes are symmetric. Plantars are downgoing. Sensation appears intact bilaterally. Gait was not tested.   ASSESSMENT/PLAN Duane Mercado is a 74 y.o. male with history of obstructive sleep apnea, atrial fibrillation, not on anticoagulation  presenting with left-sided weakness. He did not receive IV t-PA due to delay in arrival.   Stroke:  Non-Dominant R brain infarct too small to be seen on imaging with continued weakness, infarct embolic secondary to atrial fibrillation   Left handed  Resultant  Mild Rt hemiparesis improving  MRI  No stroke seen  MRA  Focal severe stenosis proximal right M2,  Right MCA branches mildly attenuated. Moderate multi focal atheromatous irregularity throughout posterior cerebral arteries, with focal moderate stenoses within the right P1/P2 segment and mid left P2 segment.  Short-segment moderate stenosis within the supraclinoid right ICA. Distal small vessel atheromatous irregularity.     Lower extremity Doppler negative  Carotid Doppler  No significant stenosis   2D Echo  No source of embolus   LDL 108  HgbA1c 5.9  Lovenox 40 mg sq daily for VTE prophylaxis Diet NPO time specified  aspirin 81 mg orally every day prior to admission, now on aspirin 325 mg orally every day. Recommend use of Eliquis post pacer  Patient counseled to be compliant with his antithrombotic medications  once they are started  Ongoing aggressive stroke risk factor management  Recommend practice with dental insturments prior to working on a patient  Therapy recommendations:  pending  Disposition:  Anticipate return home  Atrial Fibrillation  Home anticoagulation:  none  Recommend NOAC (eliquis) for secondary stroke prevention  CHA2DS2-VASc Score = 5, ?2 oral anticoagulation recommended  Age in Years:  ?89   +2    Sex:  Male   0  Hypertension History:  yes   +1     Diabetes Mellitus:  0   Congestive Heart Failure History:  0  Vascular Disease History:  0     Stroke/TIA/Thromboembolism History:  yes   +2  Hypertension  Stable  Permissive hypertension from stroke standpoint (OK if < 220/120) but gradually normalize in 5-7 days  Hyperlipidemia  Home meds:  No statin resumed in hospital  LDL 108,  goal < 70  Lipitor 20 mg daily added this admission  Continue statin at discharge  Other Stroke Risk Factors  Advanced age  ETOH use  Obstructive sleep apnea testing inconclusive in past, asked to come back for secondary nigh testing and he did not, reports he does not have a CPAP at home. States he will reconsider follow up given it is a stroke risk factor.  Other Active Problems  Complete heart block with pacer placed 07/13/2015. Cards plans NOAC following pacer  Right lower extremity pain. History of DVT 25 years ago. LE doppler neg  NOTHING FURTHER TO ADD FROM THE STROKE STANDPOINT  Patient has a 10-15% risk of having another stroke over the next year, the highest risk is within 2 weeks of the most recent stroke/TIA (risk of having a stroke following a stroke or TIA is the same).  Ongoing risk factor control by Primary Care Physician  Stroke Service will sign off. Please call should any needs arise.  Follow-up Stroke Clinic at [facility] with Dr. Antony Contras in 2 months, order placed.   Hospital  day # 2  Radene [facility] for Pager information 07/14/2015 8:01 AM   I have personally examined this patient, reviewed notes, independently viewed imaging studies, participated in medical decision making and plan of care. I have made any additions or clarifications directly to the above note. Agree with note above. Follow-up in the stroke clinic in 2 months.  Antony Contras, MD Medical Director [facility] Stroke Center Pager: [phone] 07/14/2015 2:31 PM  To contact Stroke Continuity provider, please refer to [email or web address] After hours, contact General Neurology

## 2015-07-14 NOTE — Care Management Note (Signed)
Case Management Note Marvetta Gibbons RN, BSN Unit 2W-Case Manager [phone]  Patient Details  Name: Duane Mercado MRN: [id number] Date of Birth: [birth year]  Subjective/Objective:     Pt admitted with TIA  And CHB s/p pacemaker placement           Action/Plan: PTA pt lived at home - independent- plan to return home  Expected Discharge Date:  07/14/15               Expected Discharge Plan:  Home/Self Care  In-House Referral:     Discharge planning Services  CM Consult, Medication Assistance  Post Acute Care Choice:    Choice offered to:     DME Arranged:    DME Agency:     HH Arranged:    [place] Agency:     Status of Service:  Completed, signed off  Medicare Important Message Given:    Date Medicare IM Given:    Medicare IM give by:    Date Additional Medicare IM Given:    Additional Medicare Important Message give by:     If discussed at [place] of Stay Meetings, dates discussed:    Additional Comments:  07/14/15- received call from MD- plan to start pt on Eliquis (Monday 07/17/15) wanted to be sure pt receives 30 day free card for Eliquis--- spoke with pt and son at bedside- 30 day free card given to pt explained use and benefits of card. Pt uses [facility] on [address] which has drug in stock.  Per rep Blue Medicare:   Eliquis: Tier 3, $30, for 30 day retail. If the covered drug cost amount is less than $30 the patient will pay the lower cost.  No auth required  Patient can use most major retail pharmacies    Marvetta Gibbons [place], [place] 07/14/2015, 9:46 AM

## 2015-07-26 ENCOUNTER — Ambulatory Visit (INDEPENDENT_AMBULATORY_CARE_PROVIDER_SITE_OTHER): Payer: Medicare Other | Admitting: *Deleted

## 2015-07-26 DIAGNOSIS — I442 Atrioventricular block, complete: Secondary | ICD-10-CM | POA: Diagnosis not present

## 2015-07-26 DIAGNOSIS — R001 Bradycardia, unspecified: Secondary | ICD-10-CM | POA: Diagnosis not present

## 2015-07-26 LAB — CUP PACEART INCLINIC DEVICE CHECK
Battery Voltage: 3.08 V
Brady Statistic RA Percent Paced: 26.53 %
Implantable Lead Implant Date: 20161007
Implantable Lead Location: 753860
Implantable Lead Model: 5076
Lead Channel Impedance Value: 323 Ohm
Lead Channel Impedance Value: 456 Ohm
Lead Channel Impedance Value: 589 Ohm
Lead Channel Pacing Threshold Amplitude: 0.625 V
Lead Channel Sensing Intrinsic Amplitude: 0.25 mV
Lead Channel Setting Pacing Pulse Width: 0.4 ms
MDC IDC LEAD IMPLANT DT: 20161007
MDC IDC LEAD LOCATION: 753859
MDC IDC MSMT LEADCHNL RA SENSING INTR AMPL: 0.25 mV
MDC IDC MSMT LEADCHNL RV IMPEDANCE VALUE: 475 Ohm
MDC IDC MSMT LEADCHNL RV PACING THRESHOLD PULSEWIDTH: 0.4 ms
MDC IDC MSMT LEADCHNL RV SENSING INTR AMPL: 13.5 mV
MDC IDC MSMT LEADCHNL RV SENSING INTR AMPL: 15.375 mV
MDC IDC SESS DTM: 20161019142706
MDC IDC SET LEADCHNL RA PACING AMPLITUDE: 3.5 V
MDC IDC SET LEADCHNL RV PACING AMPLITUDE: 3.5 V
MDC IDC SET LEADCHNL RV SENSING SENSITIVITY: 4 mV
MDC IDC STAT BRADY AP VP PERCENT: 26.48 %
MDC IDC STAT BRADY AP VS PERCENT: 0.05 %
MDC IDC STAT BRADY AS VP PERCENT: 72.66 %
MDC IDC STAT BRADY AS VS PERCENT: 0.8 %
MDC IDC STAT BRADY RV PERCENT PACED: 99.15 %

## 2015-07-26 NOTE — Progress Notes (Signed)
Wound check appointment. Steri-strips removed. Wound without redness or edema. Incision edges approximated, wound well healed. Normal device function. Threshold, sensing, and impedances consistent with implant measurements. Device programmed at 3.5V for extra safety margin until 3 month visit. Histogram distribution appropriate for patient and level of activity. Persistent AF since implant +Eliquis/DDIR mode. No high ventricular rates noted. Patient educated about wound care, arm mobility, lifting restrictions. ROV in 3 months with WC. Follow up with AF clinic as scheduled.

## 2015-08-03 DIAGNOSIS — I2510 Atherosclerotic heart disease of native coronary artery without angina pectoris: Secondary | ICD-10-CM | POA: Diagnosis not present

## 2015-08-03 DIAGNOSIS — I482 Chronic atrial fibrillation: Secondary | ICD-10-CM | POA: Diagnosis not present

## 2015-08-03 DIAGNOSIS — G459 Transient cerebral ischemic attack, unspecified: Secondary | ICD-10-CM | POA: Diagnosis not present

## 2015-08-03 DIAGNOSIS — Z950 Presence of cardiac pacemaker: Secondary | ICD-10-CM | POA: Diagnosis not present

## 2015-08-03 DIAGNOSIS — Z7901 Long term (current) use of anticoagulants: Secondary | ICD-10-CM | POA: Diagnosis not present

## 2015-08-03 DIAGNOSIS — E7800 Pure hypercholesterolemia, unspecified: Secondary | ICD-10-CM | POA: Diagnosis not present

## 2015-08-03 DIAGNOSIS — I517 Cardiomegaly: Secondary | ICD-10-CM | POA: Diagnosis not present

## 2015-08-03 DIAGNOSIS — Z23 Encounter for immunization: Secondary | ICD-10-CM | POA: Diagnosis not present

## 2015-08-03 DIAGNOSIS — Z86010 Personal history of colonic polyps: Secondary | ICD-10-CM | POA: Diagnosis not present

## 2015-08-18 ENCOUNTER — Ambulatory Visit (HOSPITAL_COMMUNITY)
Admit: 2015-08-18 | Discharge: 2015-08-18 | Disposition: A | Discharge status: Home / Self Care | Payer: Medicare Other | Source: Ambulatory Visit | Attending: Nurse Practitioner | Admitting: Nurse Practitioner

## 2015-08-18 VITALS — BP 158/94 | HR 63 | Ht 73.0 in | Wt 226.4 lb

## 2015-08-18 DIAGNOSIS — I10 Essential (primary) hypertension: Secondary | ICD-10-CM | POA: Diagnosis not present

## 2015-08-18 DIAGNOSIS — Z950 Presence of cardiac pacemaker: Secondary | ICD-10-CM | POA: Insufficient documentation

## 2015-08-18 DIAGNOSIS — I442 Atrioventricular block, complete: Secondary | ICD-10-CM | POA: Insufficient documentation

## 2015-08-18 DIAGNOSIS — I4892 Unspecified atrial flutter: Secondary | ICD-10-CM | POA: Insufficient documentation

## 2015-08-18 DIAGNOSIS — Z8249 Family history of ischemic heart disease and other diseases of the circulatory system: Secondary | ICD-10-CM | POA: Diagnosis not present

## 2015-08-18 DIAGNOSIS — Z7902 Long term (current) use of antithrombotics/antiplatelets: Secondary | ICD-10-CM | POA: Diagnosis not present

## 2015-08-18 DIAGNOSIS — I4891 Unspecified atrial fibrillation: Secondary | ICD-10-CM | POA: Insufficient documentation

## 2015-08-18 DIAGNOSIS — Z8673 Personal history of transient ischemic attack (TIA), and cerebral infarction without residual deficits: Secondary | ICD-10-CM | POA: Insufficient documentation

## 2015-08-18 DIAGNOSIS — Z79899 Other long term (current) drug therapy: Secondary | ICD-10-CM | POA: Insufficient documentation

## 2015-08-19 ENCOUNTER — Encounter (HOSPITAL_COMMUNITY): Payer: Self-pay | Admitting: Nurse Practitioner

## 2015-08-19 NOTE — Progress Notes (Signed)
Patient ID: Duane Mercado, male   DOB: 05-Sep-1941, 74 y.o.   MRN: [id number]     Primary Care Physician: Duane Fair, MD Referring Physician: [facility] f/u Electrophysiologist: Dr. Royce Macadamia Mercado is a 74 y.o. male  that presented to [facility] with right sided deficits and slurred speech, which completely resolved spontaneously. CT/MRI showed no acute intracranial abnormalities. MRA showed severe stenosis of right M2 without occlusion. 2D echocardiogram showed EF of 50-55% with no wall motion abnormalities and evidence of LVH. Carotid doppler showed 1-39% bilateral ICA stenosis. LDL 108 (see below) and HgbA1C 5.9. Neurology consulted- initiated 325 mg Aspirin QD and recommended use of anticoagulation post pacemaker placement-starting on 07/17/15. Patient instructed to stop taking aspirin once he has started on Eliquis.   Complete heart block with atrial fibrillation/atrial flutter: Patient has a history of atrial flutter, but discontinued Coumadin on his own and replaced it with 81 mg Aspirin. Electrophysiology consulted- underwent permanent pacemaker insertion on 10/6 d/t complete heart block. CHA2DS2-VASC score 2, Duane require anticoagulation starting on 10/10 per EP. Patient Duane follow-up with the atrial fibrillation clinic for further rate control/rhythm control issues.  He presents to the afib clinic  and reports that he is feeling stronger everyday. He worked in his yard all last weekend and did not describe any exertional dyspnea or fatigue. He has been on blood thinner with fail since 10/10 and no bleeding issues. Pacer site is healed. We discussed rhythm options going forward, specially cardioversion, but pt states he has had several times in the past,  and  not especially excited about the idea right now, especially since he is feeling well and not really symptomatic with aflutter. He has not had any further stroke symptoms.  Today, he denies symptoms of palpitations, chest pain, shortness of  breath, orthopnea, PND, lower extremity edema, dizziness, presyncope, syncope, or neurologic sequela. The patient is tolerating medications without difficulties and is otherwise without complaint today.   Past Medical History  Diagnosis Date  . Asthma     w seasonal rhinitis  . Sigmoid diverticulosis   . Hx of cardiovascular stress test     coronary artery calcifications with normal nuclear study in 2010  . Tubulovillous adenoma polyp of colon   . Coronary artery calcification seen on CAT scan     nomal nuclear stress test  . Paroxysmal atrial flutter [facility])    Past Surgical History  Procedure Laterality Date  . Tubulovillous adenomatous  12/2008    polyps removed colonoscopically  . Ep implantable device N/A 07/13/2015    Procedure: Pacemaker Implant;  Surgeon: Duane Meredith Leeds, MD;  Location: [place] [facility];  Service: Cardiovascular;  Laterality: N/A;    Current Outpatient Prescriptions  Medication Sig Dispense Refill  . apixaban (ELIQUIS) 5 MG TABS tablet Take 1 tablet (5 mg total) by mouth 2 (two) times daily. Start on Monday-07/17/15 120 tablet 0  . atorvastatin (LIPITOR) 20 MG tablet Take 1 tablet (20 mg total) by mouth daily at 6 PM. 90 tablet 0  . lisinopril (PRINIVIL,ZESTRIL) 20 MG tablet Take 1 tablet (20 mg total) by mouth daily. 90 tablet 0  . Multiple Vitamin (MULTIVITAMIN WITH MINERALS) TABS tablet Take 1 tablet by mouth daily.     No current facility-administered medications for this encounter.    No Known Allergies  Social History   Social History  . Marital Status: Divorced    Spouse Name: N/A  . Number of Children: N/A  . Years of Education:  N/A   Occupational History  . Not on file.   Social History Main Topics  . Smoking status: Never Smoker   . Smokeless tobacco: Not on file  . Alcohol Use: 0.0 oz/week    0 Standard drinks or equivalent per week     Comment: once daily wine  . Drug Use: No  . Sexual Activity: Yes    Birth Control/  Protection: Condom   Other Topics Concern  . Not on file   Social History Narrative    Family History  Problem Relation Age of Onset  . Hypertension Mother   . Hypertension Father   . Heart disease Father   . Melanoma Father     ROS- All systems are reviewed and negative except as per the HPI above  Physical Exam: Filed Vitals:   08/18/15 0900  BP: 158/94  Pulse: 63  Height: 6\' 1"  (1.854 m)  Weight: 226 lb 6.4 oz (102.694 kg)    GEN- The patient is well appearing, alert and oriented x 3 today.   Head- normocephalic, atraumatic Eyes-  Sclera clear, conjunctiva pink Ears- hearing intact Oropharynx- clear Neck- supple, no JVP Lymph- no cervical lymphadenopathy Lungs- Clear to ausculation bilaterally, normal work of breathing Heart- Irregular rate and rhythm, no murmurs, rubs or gallops, PMI not laterally displaced. Pacer site healed GI- soft, NT, ND, + BS Extremities- no clubbing, cyanosis, or edema MS- no significant deformity or atrophy Skin- no rash or lesion Psych- euthymic mood, full affect Neuro- strength and sensation are intact  EKG- Ventricular paced rhtyhm, qrs 194 ms, qtc 513 ms, v rate 63, flutter waves present.  Assessment and Plan: 1. TIA Symptoms resolved  On apixaban 5 mg bid and compliant Chadsvasc score is at least 5.  2. Afib/flutter Asymptomatic Offered pt cardioversion He would prefer  to wait and discuss further with Dr. Curt Bears on f/u appointment.  3. HTN On recheck BP improved, 148/88 States better readings at home Continue to monitor  Avoid salt  4. CHB PPM in place Incision healed  F/u with Dr. Curt Bears 1/6 F/u with Dr. Leonie Man 12/5

## 2015-08-25 ENCOUNTER — Other Ambulatory Visit: Payer: Self-pay | Admitting: Gastroenterology

## 2015-08-25 ENCOUNTER — Ambulatory Visit
Admission: RE | Admit: 2015-08-25 | Discharge: 2015-08-25 | Disposition: A | Discharge status: Home / Self Care | Payer: Medicare Other | Source: Ambulatory Visit | Attending: Gastroenterology | Admitting: Gastroenterology

## 2015-08-25 DIAGNOSIS — G8929 Other chronic pain: Secondary | ICD-10-CM

## 2015-08-25 DIAGNOSIS — R109 Unspecified abdominal pain: Secondary | ICD-10-CM | POA: Diagnosis not present

## 2015-09-11 ENCOUNTER — Ambulatory Visit (INDEPENDENT_AMBULATORY_CARE_PROVIDER_SITE_OTHER): Payer: Medicare Other | Admitting: Neurology

## 2015-09-11 ENCOUNTER — Encounter: Payer: Self-pay | Admitting: Neurology

## 2015-09-11 VITALS — BP 150/87 | HR 64 | Ht 77.0 in | Wt 222.2 lb

## 2015-09-11 DIAGNOSIS — G458 Other transient cerebral ischemic attacks and related syndromes: Secondary | ICD-10-CM | POA: Diagnosis not present

## 2015-09-11 NOTE — Progress Notes (Signed)
[facility] [address] [place]. [place] [address] [phone]       OFFICE FOLLOW-UP NOTE  [name] Date of Birth:  09/06/1941 Medical Record Number:  [id number]   HPI: 14 year Caucasian male seen today for the first office follow-up visit following hospital admission for TIA in October 2016.He had been doing well until last night 07/11/2015 around 8 PM (LKW) when he noted he was having some difficulty walking (not able to pick up either foot properly and tended to shuffle his feet). He went to sleep thinking he was tired. The next day he noted he was clumsy with his right hand (he is left handed) and noted some subjective decreased sensation in his right hand. Furthermore, his niece said that she thinks patient is having slurred speech since last night. Denies HA, vertigo, double vision, trouble swallowing, or vision impairment. Modified Rankin: Rankin Score=0. Patient was not administered TPA secondary to being out of the window. He was admitted for further evaluation and treatment. CT scan of the head on admission showed only mild chronic microvascular ischemic changes. MRI scan of the brain showed no acute infarct and and confirmed mild small vessel changes. MRA of the brain showed a focal severe stenosis of proximal right M2 branch and moderate multifocal atheromatous changes in the posterior cerebral arteries. Carotid Doppler showed no significant extracranial stenosis. Route extremity venous Doppler was negative for DVT. LDL cholesterol was elevated at 108. Hemoglobin A1c was 5.9. Patient was started on eliquis for his atrial fibrillation. In the past he had stop warfarin because of side effects and had been taking only aspirin despite a CHAD2VASc. score of 5. Patient was also found to have significant heart block and bradycardia and cardiology put in a pacemaker. 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 He states his returned back to work 2 weeks ago  and has had no problems. He still feels tired easily. He is a little bit disappointed that the pacemaker has not improved his overall stamina and thinking. He is tolerating eliquis well with only occasional bruising but no bleeding episodes. He is now gotten used to his pacemaker though he had a tough time in the first month .He is also tolerating Lipitor with only a few muscle aches which are not bothersome. 2 weeks ago he had severe abdominal pain and colic likely related to eating too much dry throat and chips. He is feeling much better now   ROS:   14 system review of systems is positive for weight loss, leg swelling, rash, skin moles, easy bruising, feeling cold, aching muscles, skin sensitivity  PMH:  Past Medical History  Diagnosis Date  . Asthma     w seasonal rhinitis  . Sigmoid diverticulosis   . Hx of cardiovascular stress test     coronary artery calcifications with normal nuclear study in 2010  . Tubulovillous adenoma polyp of colon   . Coronary artery calcification seen on CAT scan     nomal nuclear stress test  . Paroxysmal atrial flutter ([place])   . Stroke [facility])     TIA    Social History:  Social History   Social History  . Marital Status: Divorced    Spouse Name: N/A  . Number of Children: N/A  . Years of Education: N/A   Occupational History  . Not on file.   Social History Main Topics  . Smoking status: Never Smoker   . Smokeless tobacco: Not on  file  . Alcohol Use: 0.0 oz/week    0 Standard drinks or equivalent per week     Comment: once daily wine  . Drug Use: No  . Sexual Activity: Yes    Birth Control/ Protection: Condom   Other Topics Concern  . Not on file   Social History Narrative    Medications:   Current Outpatient Prescriptions on File Prior to Visit  Medication Sig Dispense Refill  . apixaban (ELIQUIS) 5 MG TABS tablet Take 1 tablet (5 mg total) by mouth 2 (two) times daily. Start on Monday-07/17/15 120 tablet 0  . atorvastatin  (LIPITOR) 20 MG tablet Take 1 tablet (20 mg total) by mouth daily at 6 PM. 90 tablet 0  . lisinopril (PRINIVIL,ZESTRIL) 20 MG tablet Take 1 tablet (20 mg total) by mouth daily. 90 tablet 0  . Multiple Vitamin (MULTIVITAMIN WITH MINERALS) TABS tablet Take 1 tablet by mouth daily.     No current facility-administered medications on file prior to visit.    Allergies:  No Known Allergies  Physical Exam General: well developed, well nourished, seated, in no evident distress Head: head normocephalic and atraumatic.  Neck: supple with no carotid or supraclavicular bruits Cardiovascular: regular rate and rhythm, no murmurs Musculoskeletal: no deformity Skin:  no rash/petichiae Vascular:  Normal pulses all extremities Filed Vitals:   09/11/15 1533  BP: 150/87  Pulse: 64   Neurologic Exam Mental Status: Awake and fully alert. Oriented to place and time. Recent and remote memory intact. Attention span, concentration and fund of knowledge appropriate. Mood and affect appropriate.  Cranial Nerves: Fundoscopic exam reveals sharp disc margins. Pupils equal, briskly reactive to light. Extraocular movements full without nystagmus. Visual fields full to confrontation. Hearing intact. Facial sensation intact. Face, tongue, palate moves normally and symmetrically.  Motor: Normal bulk and tone. Normal strength in all tested extremity muscles. Sensory.: intact to touch ,pinprick .position and vibratory sensation.  Coordination: Rapid alternating movements normal in all extremities. Finger-to-nose and heel-to-shin performed accurately bilaterally. Gait and Station: Arises from chair without difficulty. Stance is normal. Gait demonstrates normal stride length and balance . Able to heel, toe and tandem walk without difficulty.  Reflexes: 1+ and symmetric. Toes downgoing.   NIHSS 0 Modified Rankin  0   ASSESSMENT: 19 year Caucasian male with left hemispheric TIA of cardiac embolic etiology due to atrial  fibrillation/flutter in October 2016. Vascular risk factors of atrial fibrillation/flutter, hypertension, hyperlipidemia, coronary artery disease    PLAN: I had a long d/w patient about his recent TIA, risk for recurrent stroke/TIAs, personally independently reviewed imaging studies and stroke evaluation results and answered questions.Continue eliquis due to atrial fibrillation/flutter  for secondary stroke prevention and maintain strict control of hypertension with blood pressure goal below 130/90,  and lipids with LDL cholesterol goal below 70 mg/dL. I also advised the patient to eat a healthy diet with plenty of whole grains, cereals, fruits and vegetables, exercise regularly and maintain ideal body weight Greater than 50% of time during this 25 minute visit was spent on counseling,explanation of diagnosis, planning of further management, discussion with patient and family and coordination of care .Followup in the future with me in 6 months or call earlier if necessary  Antony Contras, MD  Note: This document was prepared with digital dictation and possible smart phrase technology. Any transcriptional errors that result from this process are unintentional

## 2015-09-11 NOTE — Patient Instructions (Signed)
I had a long d/w patient about his recent TIA, risk for recurrent stroke/TIAs, personally independently reviewed imaging studies and stroke evaluation results and answered questions.Continue eliquis due to atrial fibrillation/flutter  for secondary stroke prevention and maintain strict control of hypertension with blood pressure goal below 130/90,  and lipids with LDL cholesterol goal below 70 mg/dL. I also advised the patient to eat a healthy diet with plenty of whole grains, cereals, fruits and vegetables, exercise regularly and maintain ideal body weight Followup in the future with me in 6 months or call earlier if necessary Stroke Prevention Some medical conditions and behaviors are associated with an increased chance of having a stroke. You may prevent a stroke by making healthy choices and managing medical conditions. HOW CAN I REDUCE MY RISK OF HAVING A STROKE?   Stay physically active. Get at least 30 minutes of activity on most or all days.  Do not smoke. It may also be helpful to avoid exposure to secondhand smoke.  Limit alcohol use. Moderate alcohol use is considered to be:  No more than 2 drinks per day for men.  No more than 1 drink per day for nonpregnant women.  Eat healthy foods. This involves:  Eating 5 or more servings of fruits and vegetables a day.  Making dietary changes that address high blood pressure (hypertension), high cholesterol, diabetes, or obesity.  Manage your cholesterol levels.  Making food choices that are high in fiber and low in saturated fat, trans fat, and cholesterol may control cholesterol levels.  Take any prescribed medicines to control cholesterol as directed by your health care provider.  Manage your diabetes.  Controlling your carbohydrate and sugar intake is recommended to manage diabetes.  Take any prescribed medicines to control diabetes as directed by your health care provider.  Control your hypertension.  Making food choices that  are low in salt (sodium), saturated fat, trans fat, and cholesterol is recommended to manage hypertension.  Ask your health care provider if you need treatment to lower your blood pressure. Take any prescribed medicines to control hypertension as directed by your health care provider.  If you are 60-34 years of age, have your blood pressure checked every 3-5 years. If you are 18 years of age or older, have your blood pressure checked every year.  Maintain a healthy weight.  Reducing calorie intake and making food choices that are low in sodium, saturated fat, trans fat, and cholesterol are recommended to manage weight.  Stop drug abuse.  Avoid taking birth control pills.  Talk to your health care provider about the risks of taking birth control pills if you are over 52 years old, smoke, get migraines, or have ever had a blood clot.  Get evaluated for sleep disorders (sleep apnea).  Talk to your health care provider about getting a sleep evaluation if you snore a lot or have excessive sleepiness.  Take medicines only as directed by your health care provider.  For some people, aspirin or blood thinners (anticoagulants) are helpful in reducing the risk of forming abnormal blood clots that can lead to stroke. If you have the irregular heart rhythm of atrial fibrillation, you should be on a blood thinner unless there is a good reason you cannot take them.  Understand all your medicine instructions.  Make sure that other conditions (such as anemia or atherosclerosis) are addressed. SEEK IMMEDIATE MEDICAL CARE IF:   You have sudden weakness or numbness of the face, arm, or leg, especially on one side  of the body.  Your face or eyelid droops to one side.  You have sudden confusion.  You have trouble speaking (aphasia) or understanding.  You have sudden trouble seeing in one or both eyes.  You have sudden trouble walking.  You have dizziness.  You have a loss of balance or  coordination.  You have a sudden, severe headache with no known cause.  You have new chest pain or an irregular heartbeat. Any of these symptoms may represent a serious problem that is an emergency. Do not wait to see if the symptoms will go away. Get medical help at once. Call your local emergency services (911 in [place] Do not drive yourself to the hospital.   This information is not intended to replace advice given to you by your health care provider. Make sure you discuss any questions you have with your health care provider.   Document Released: 10/31/2004 Document Revised: 10/14/2014 Document Reviewed: 03/26/2013 Elsevier Interactive Patient Education [employer].

## 2015-09-13 ENCOUNTER — Encounter: Payer: Self-pay | Admitting: Cardiology

## 2015-10-13 ENCOUNTER — Ambulatory Visit (INDEPENDENT_AMBULATORY_CARE_PROVIDER_SITE_OTHER): Payer: Medicare Other | Admitting: Cardiology

## 2015-10-13 ENCOUNTER — Encounter: Payer: Self-pay | Admitting: Cardiology

## 2015-10-13 VITALS — BP 142/82 | HR 68 | Ht 77.0 in | Wt 218.2 lb

## 2015-10-13 DIAGNOSIS — Z950 Presence of cardiac pacemaker: Secondary | ICD-10-CM | POA: Diagnosis not present

## 2015-10-13 DIAGNOSIS — I442 Atrioventricular block, complete: Secondary | ICD-10-CM | POA: Diagnosis not present

## 2015-10-13 NOTE — Progress Notes (Signed)
Electrophysiology Office Note   Date:  10/13/2015   ID:  Mathews Robinsons, DOB September 04, 1941, MRN [id number]  PCP:  [name], MD  Cardiologist:  Fransico Him Primary Electrophysiologist:  Joani Cosma Meredith Leeds, MD    Chief Complaint  Patient presents with  . Pacemaker Check    90 day post implant     History of Present Illness: Levell Uplinger is a 75 y.o. male who presents today for electrophysiology evaluation.  He has a history of coronary consultations: CT with a negative Myoview, sleep apnea not on CPAP, atrial flutter status post multiple cardioversions in 2003 and 2007. On the night of 07/11/15 he had significant fatigue and felt very imbalanced. He noticed slightly worsening right-sided m weakness when he walked. His speech was also slurred according to his family. He was brought to [facility] for further evaluation. He was noted to have atrial fibrillation of unknown duration and a slow ventricular response in the 30s. At that hospitalization he had a dual chamber pacemaker placed. Neurology at that time could not rule out MRI negative cerebral ischemia. He was placed on Eliquis at the time due to his TIA.   Today, he denies symptoms of palpitations, chest pain, shortness of breath, orthopnea, PND, lower extremity edema, claudication, dizziness, presyncope, syncope, bleeding, or neurologic sequela. The patient is tolerating medications without difficulties and is otherwise without complaint today.    Past Medical History  Diagnosis Date  . Asthma     w seasonal rhinitis  . Sigmoid diverticulosis   . Hx of cardiovascular stress test     coronary artery calcifications with normal nuclear study in 2010  . Tubulovillous adenoma polyp of colon   . Coronary artery calcification seen on CAT scan     nomal nuclear stress test  . Paroxysmal atrial flutter ([place])   . Stroke [facility])     TIA   Past Surgical History  Procedure Laterality Date  . Tubulovillous adenomatous  12/2008    polyps  removed colonoscopically  . Ep implantable device N/A 07/13/2015    Procedure: Pacemaker Implant;  Surgeon: Ora Bollig Meredith Leeds, MD;  Location: [place] [facility];  Service: Cardiovascular;  Laterality: N/A;     Current Outpatient Prescriptions  Medication Sig Dispense Refill  . apixaban (ELIQUIS) 5 MG TABS tablet Take 1 tablet (5 mg total) by mouth 2 (two) times daily. Start on Monday-07/17/15 120 tablet 0  . atorvastatin (LIPITOR) 20 MG tablet Take 1 tablet (20 mg total) by mouth daily at 6 PM. 90 tablet 0  . lisinopril (PRINIVIL,ZESTRIL) 20 MG tablet Take 1 tablet (20 mg total) by mouth daily. 90 tablet 0  . Multiple Vitamin (MULTIVITAMIN WITH MINERALS) TABS tablet Take 1 tablet by mouth daily.     No current facility-administered medications for this visit.    Allergies:   Review of patient's allergies indicates no known allergies.   Social History:  The patient  reports that he has never smoked. He does not have any smokeless tobacco history on file. He reports that he drinks alcohol. He reports that he does not use illicit drugs.   Family History:  The patient's family history includes Heart disease in his father; Hypertension in his father and mother; Melanoma in his father.    ROS:  Please see the history of present illness.   Otherwise, review of systems is positive for back pain, dizziness after taking BP medications.   All other systems are reviewed and negative.    PHYSICAL  EXAM: VS:  BP 142/82 mmHg  Pulse 68  Ht 6\' 5"  (1.956 m)  Wt 218 lb 3.2 oz (98.975 kg)  BMI 25.87 kg/m2 , BMI Body mass index is 25.87 kg/(m^2). GEN: Well nourished, well developed, in no acute distress HEENT: normal Neck: no JVD, carotid bruits, or masses Cardiac: RRR; no murmurs, rubs, or gallops,no edema  Respiratory:  clear to auscultation bilaterally, normal work of breathing GI: soft, nontender, nondistended, + BS MS: no deformity or atrophy Skin: warm and dry,  device pocket is well  healed Neuro:  Strength and sensation are intact Psych: euthymic mood, full affect  EKG:  EKG is ordered today. The ekg ordered today shows atrial fibrillation, V paced rate 68   Device interrogation is reviewed today in detail.  See PaceArt for details.   Recent Labs: 07/12/2015: ALT 26; BUN 18; Creatinine, Ser 0.84; Hemoglobin 14.7; Platelets 151; Potassium 4.0; Sodium 140    Lipid Panel     Component Value Date/Time   CHOL 167 07/13/2015 0704   TRIG 64 07/13/2015 0704   HDL 46 07/13/2015 0704   CHOLHDL 3.6 07/13/2015 0704   VLDL 13 07/13/2015 0704   LDLCALC 108* 07/13/2015 0704     Wt Readings from Last 3 Encounters:  10/13/15 218 lb 3.2 oz (98.975 kg)  09/11/15 222 lb 3.2 oz (100.789 kg)  08/18/15 226 lb 6.4 oz (102.694 kg)      Other studies Reviewed: Additional studies/ records that were reviewed today include: TTE 07/13/15 Review of the above records today demonstrates:  - Left ventricle: The cavity size was normal. Wall thickness was increased in a pattern of moderate LVH. Systolic function was normal. The estimated ejection fraction was in the range of 50% to 55%. Wall motion was normal; there were no regional wall motion abnormalities. - Aortic valve: There was mild regurgitation. - Left atrium: The atrium was moderately dilated. - Right atrium: The atrium was moderately dilated.   ASSESSMENT AND PLAN:  1.  Complete heart block: had dual chamber pacemaker placed on 07-13-15 without issues.  Pacemaker working without any issues.  No changes necessary today.  2. Persistent atrial fibrillation: CHADS2VASC of 4.  He is on Eliquis and we Torence Palmeri continue that medication.  Currently in atypical atrial flutter.  Offered cardioversion which he says that he Jeanine Caven think about.  We have also given him information on tikosyn loading which would hopefully keep him in sinus rhythm.  He has been on flecainide in the past without issues.   Current medicines are  reviewed at length with the patient today.   The patient does not have concerns regarding his medicines.  The following changes were made today:  none  Labs/ tests ordered today include:  Orders Placed This Encounter  Procedures  . EKG 12-Lead     Disposition:   FU with Torra Pala 3 months  Signed, Dulcinea Kinser Meredith Leeds, MD  10/13/2015 11:22 AM     [facility] [address] [place] [address] [phone] (office) [phone] (fax)

## 2015-10-13 NOTE — Patient Instructions (Addendum)
Medication Instructions:  Your physician recommends that you continue on your current medications as directed. Please refer to the Current Medication list given to you today.  Labwork: None ordered  Testing/Procedures: None ordered  Follow-Up: Remote monitoring is used to monitor your Pacemaker of ICD from home. This monitoring reduces the number of office visits required to check your device to one time per year. It allows [place] to keep an eye on the functioning of your device to ensure it is working properly. You are scheduled for a device check from home on 01/15/2016. You may send your transmission at any time that day. If you have a wireless device, the transmission will be sent automatically. After your physician reviews your transmission, you will receive a postcard with your next transmission date.  Your physician wants you to follow-up in: 6 months with Dr. Curt Bears. You will receive a reminder letter in the mail two months in advance. If you don't receive a letter, please call our office to schedule the follow-up appointment.  If you need a refill on your cardiac medications before your next appointment, please call your pharmacy.  Thank you for choosing [facility]!!   Trinidad Curet, RN [phone]     Information about Tikosyn for you to read on: Dofetilide capsules What is this medicine? DOFETILIDE (doe FET il ide) is an antiarrhythmic drug. It helps make your heart beat regularly. This medicine also helps to slow rapid heartbeats. This medicine may be used for other purposes; ask your health care provider or pharmacist if you have questions. What should I tell my health care provider before I take this medicine? They need to know if you have any of these conditions: -heart disease -history of low levels of potassium or magnesium -kidney disease -liver disease -an unusual or allergic reaction to dofetilide, other medicines, foods, dyes, or preservatives -pregnant or  trying to get pregnant -breast-feeding How should I use this medicine? Take this medicine by mouth with a glass of water. Follow the directions on the prescription label. You can take this medicine with or without food. Do not drink grapefruit juice with this medicine. Take your doses at regular intervals. Do not take your medicine more often than directed. Do not stop taking this medicine suddenly. This may cause serious, heart-related side effects. Your doctor will tell you how much medicine to take. If your doctor wants you to stop the medicine, the dose will be slowly lowered over time to avoid any side effects. Talk to your pediatrician regarding the use of this medicine in children. Special care may be needed. Overdosage: If you think you have taken too much of this medicine contact a poison control center or emergency room at once. NOTE: This medicine is only for you. Do not share this medicine with others. What if I miss a dose? If you miss a dose, take it as soon as you can. If it is almost time for your next dose, take only that dose. Do not take double or extra doses. What may interact with this medicine? Do not take this medicine with any of the following medications: -cimetidine -dolutegravir -hydrochlorothiazide alone or in combination with other medicines -ketoconazole -megestrol -other medicines that prolong the QT interval (cause an abnormal heart rhythm) -prochlorperazine -trimethoprim alone or in combination with sulfamethoxazole -verapamil This medicine may also interact with the following medications: -amiloride -certain antidepressants like fluvoxamine or paroxetine -certain antiviral medicines for HIV or AIDS like atazanavir or darunavir -certain medicines for fungal  infections like clotrimazole or miconazole -digoxin -diltiazem -dronabinol, THC -grapefruit juice -metformin -nefazodone -triamterene -zafirlukast This list may not describe all possible  interactions. Give your health care provider a list of all the medicines, herbs, non-prescription drugs, or dietary supplements you use. Also tell them if you smoke, drink alcohol, or use illegal drugs. Some items may interact with your medicine. What should I watch for while using this medicine? Visit your doctor or health care professional for regular checks on your progress. Wear a medical ID bracelet or chain, and carry a card that describes your disease and details of your medicine and dosage times. Check your heart rate and blood pressure regularly while you are taking this medicine. Ask your doctor or health care professional what your heart rate and blood pressure should be, and when you should contact him or her. Your doctor or health care professional also may schedule regular tests to check your progress. You will be started on this medicine in a specialized facility for at least three days. You will be monitored to find the right dose of medicine for you. It is very important that you take your medicine exactly as prescribed when you leave the hospital. The correct dosing of this medicine is very important to treat your condition and prevent possible serious side effects. What side effects may I notice from receiving this medicine? Side effects that you should report to your doctor or health care professional as soon as possible: -allergic reactions like skin rash, itching or hives, swelling of the face, lips, or tongue -breathing problems -dizziness -fast or rapid beating of the heart -feeling faint or lightheaded -swelling of the ankles -unusually weak or tired -vomiting Side effects that usually do not require medical attention (report to your doctor or health care professional if they continue or are bothersome): -cough -diarrhea -difficulty sleeping -headache -nausea -stomach pain This list may not describe all possible side effects. Call your doctor for medical advice about  side effects. You may report side effects to FDA at 1-800-FDA-1088. Where should I keep my medicine? Keep out of the reach of children. Store at room temperature between 15 and 30 degrees C (59 and 86 degrees F). Protect the medicine from moisture or humidity. Keep container tightly closed. Throw away any unused medicine after the expiration date. NOTE: This sheet is a summary. It may not cover all possible information. If you have questions about this medicine, talk to your doctor, pharmacist, or health care provider.    2016, Elsevier/Gold Standard. (2014-05-16 16:21:18)

## 2015-11-29 DIAGNOSIS — L570 Actinic keratosis: Secondary | ICD-10-CM | POA: Diagnosis not present

## 2015-11-29 DIAGNOSIS — D1801 Hemangioma of skin and subcutaneous tissue: Secondary | ICD-10-CM | POA: Diagnosis not present

## 2015-11-29 DIAGNOSIS — L859 Epidermal thickening, unspecified: Secondary | ICD-10-CM | POA: Diagnosis not present

## 2015-11-29 DIAGNOSIS — D171 Benign lipomatous neoplasm of skin and subcutaneous tissue of trunk: Secondary | ICD-10-CM | POA: Diagnosis not present

## 2015-11-29 DIAGNOSIS — L821 Other seborrheic keratosis: Secondary | ICD-10-CM | POA: Diagnosis not present

## 2015-11-29 DIAGNOSIS — L814 Other melanin hyperpigmentation: Secondary | ICD-10-CM | POA: Diagnosis not present

## 2016-01-10 DIAGNOSIS — Z1389 Encounter for screening for other disorder: Secondary | ICD-10-CM | POA: Diagnosis not present

## 2016-01-10 DIAGNOSIS — Z1211 Encounter for screening for malignant neoplasm of colon: Secondary | ICD-10-CM | POA: Diagnosis not present

## 2016-01-10 DIAGNOSIS — Z0000 Encounter for general adult medical examination without abnormal findings: Secondary | ICD-10-CM | POA: Diagnosis not present

## 2016-01-15 ENCOUNTER — Telehealth: Payer: Self-pay | Admitting: Cardiology

## 2016-01-15 ENCOUNTER — Ambulatory Visit (INDEPENDENT_AMBULATORY_CARE_PROVIDER_SITE_OTHER): Payer: Medicare Other | Admitting: *Deleted

## 2016-01-15 DIAGNOSIS — I442 Atrioventricular block, complete: Secondary | ICD-10-CM

## 2016-01-15 NOTE — Telephone Encounter (Signed)
LMOVM reminding pt to send remote transmission.   

## 2016-01-16 NOTE — Progress Notes (Signed)
Remote pacemaker transmission.   

## 2016-01-26 DIAGNOSIS — H2513 Age-related nuclear cataract, bilateral: Secondary | ICD-10-CM | POA: Diagnosis not present

## 2016-01-26 DIAGNOSIS — H524 Presbyopia: Secondary | ICD-10-CM | POA: Diagnosis not present

## 2016-02-06 LAB — CUP PACEART REMOTE DEVICE CHECK
Battery Voltage: 3.01 V
Brady Statistic AP VP Percent: 5.07 %
Brady Statistic AS VP Percent: 94.72 %
Brady Statistic RV Percent Paced: 99.78 %
Date Time Interrogation Session: 20170410202445
Implantable Lead Location: 753860
Lead Channel Impedance Value: 494 Ohm
Lead Channel Pacing Threshold Amplitude: 0.625 V
Lead Channel Sensing Intrinsic Amplitude: 11.875 mV
Lead Channel Setting Pacing Pulse Width: 0.4 ms
Lead Channel Setting Sensing Sensitivity: 4 mV
MDC IDC LEAD IMPLANT DT: 20161007
MDC IDC LEAD IMPLANT DT: 20161007
MDC IDC LEAD LOCATION: 753859
MDC IDC MSMT BATTERY REMAINING LONGEVITY: 88 mo
MDC IDC MSMT LEADCHNL RA IMPEDANCE VALUE: 323 Ohm
MDC IDC MSMT LEADCHNL RA IMPEDANCE VALUE: 437 Ohm
MDC IDC MSMT LEADCHNL RA SENSING INTR AMPL: 0.25 mV
MDC IDC MSMT LEADCHNL RA SENSING INTR AMPL: 0.25 mV
MDC IDC MSMT LEADCHNL RV IMPEDANCE VALUE: 646 Ohm
MDC IDC MSMT LEADCHNL RV PACING THRESHOLD PULSEWIDTH: 0.4 ms
MDC IDC MSMT LEADCHNL RV SENSING INTR AMPL: 11.875 mV
MDC IDC SET LEADCHNL RA PACING AMPLITUDE: 3.5 V
MDC IDC SET LEADCHNL RV PACING AMPLITUDE: 2 V
MDC IDC STAT BRADY AP VS PERCENT: 0 %
MDC IDC STAT BRADY AS VS PERCENT: 0.21 %
MDC IDC STAT BRADY RA PERCENT PACED: 5.07 %

## 2016-02-08 DIAGNOSIS — I10 Essential (primary) hypertension: Secondary | ICD-10-CM | POA: Diagnosis not present

## 2016-02-08 DIAGNOSIS — Z950 Presence of cardiac pacemaker: Secondary | ICD-10-CM | POA: Diagnosis not present

## 2016-02-08 DIAGNOSIS — E7800 Pure hypercholesterolemia, unspecified: Secondary | ICD-10-CM | POA: Diagnosis not present

## 2016-02-08 DIAGNOSIS — G4733 Obstructive sleep apnea (adult) (pediatric): Secondary | ICD-10-CM | POA: Diagnosis not present

## 2016-02-08 DIAGNOSIS — G459 Transient cerebral ischemic attack, unspecified: Secondary | ICD-10-CM | POA: Diagnosis not present

## 2016-02-08 DIAGNOSIS — I482 Chronic atrial fibrillation: Secondary | ICD-10-CM | POA: Diagnosis not present

## 2016-02-08 DIAGNOSIS — Z7901 Long term (current) use of anticoagulants: Secondary | ICD-10-CM | POA: Diagnosis not present

## 2016-02-08 DIAGNOSIS — I2510 Atherosclerotic heart disease of native coronary artery without angina pectoris: Secondary | ICD-10-CM | POA: Diagnosis not present

## 2016-02-08 DIAGNOSIS — Z86010 Personal history of colonic polyps: Secondary | ICD-10-CM | POA: Diagnosis not present

## 2016-02-14 ENCOUNTER — Encounter: Payer: Self-pay | Admitting: Cardiology

## 2016-02-19 IMAGING — CR DG ABDOMEN 2V
2 series · 2 of 2 positions shown · non-contrast
Comparison: CT, 02/02/2011

CLINICAL DATA: Chronic abdominal pain with nausea and vomiting.
History of stomach surgery 20 years ago.

EXAM:
ABDOMEN - 2 VIEW

[w abdomen upright *]
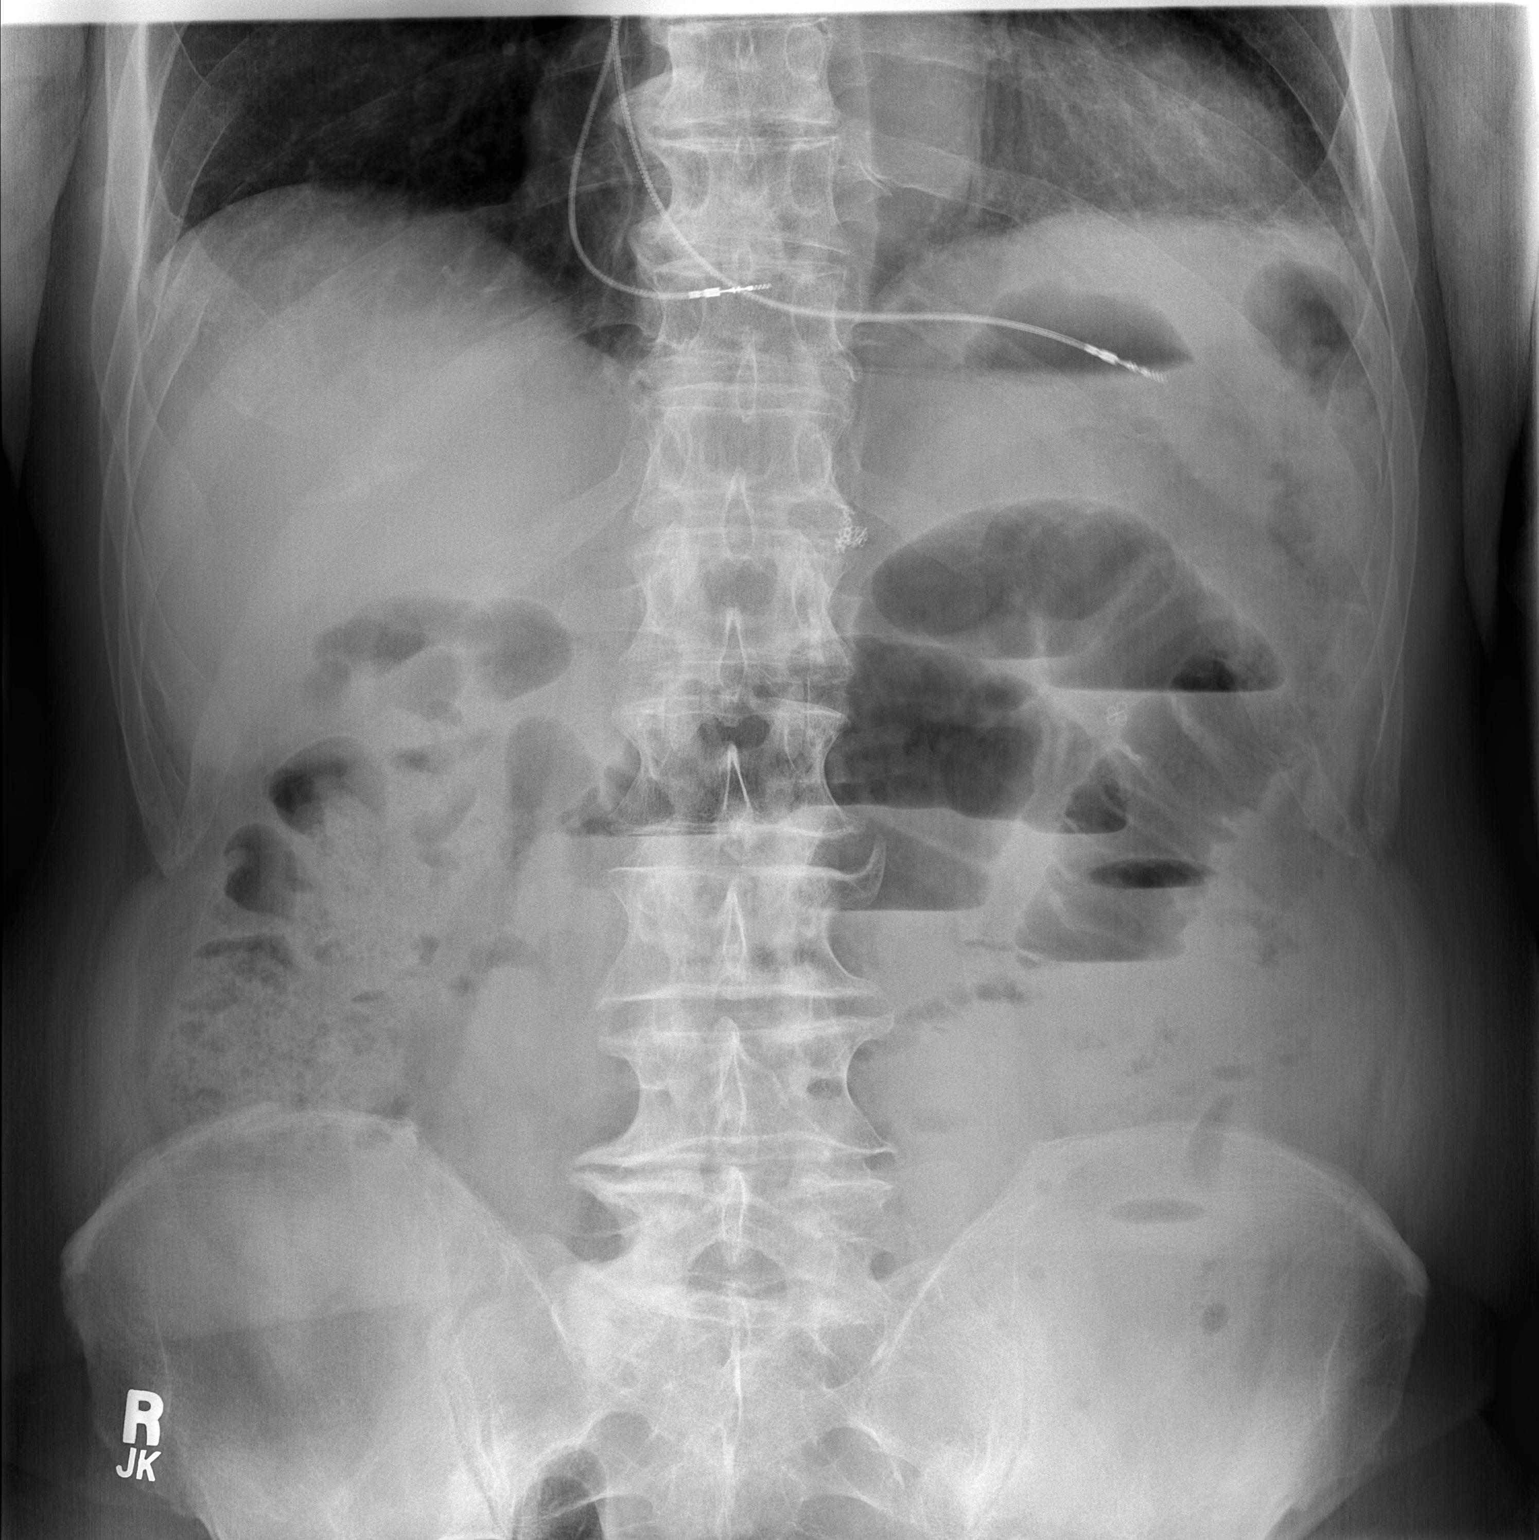

[t abdomen supine]
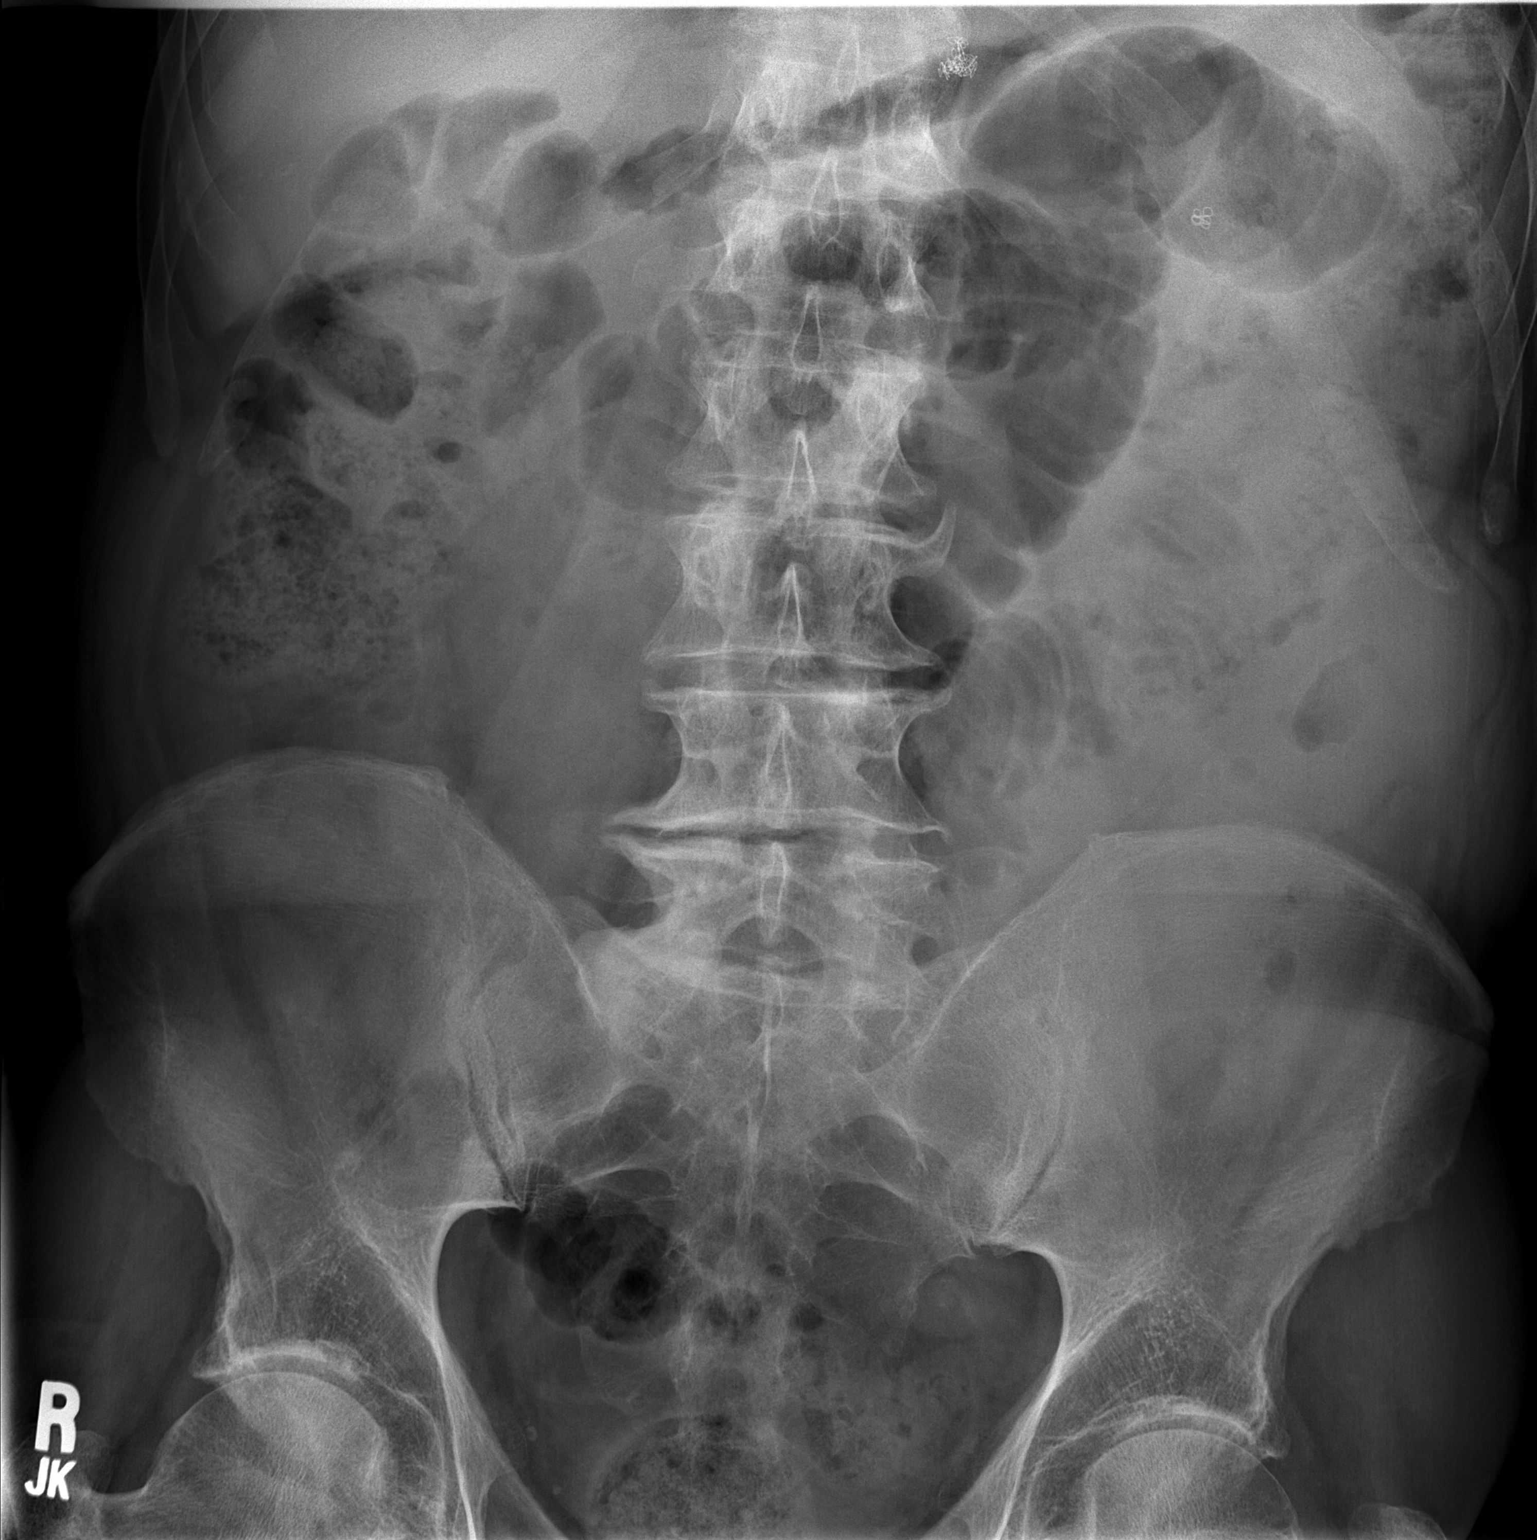

[2 of 2 positions shown; findings below may reference images not displayed]

FINDINGS: There are distended loops of small bowel with multiple air-fluid
levels at different levels. Air and stool is seen within a
nondilated colon.

There is no free air. Small surgical coil or staples projects along
the left upper lumbar spine, and lies within the proximal medial
stomach wall on the prior CT. Soft tissues are otherwise
unremarkable.

There are degenerative changes of the visualized spine.
IMPRESSION: 1. Distended loops small bowel with air-fluid levels, without
colonic distention. This is consistent with a partial small bowel
obstruction. No free air.

## 2016-02-28 ENCOUNTER — Encounter: Payer: Self-pay | Admitting: Cardiology

## 2016-03-13 ENCOUNTER — Encounter: Payer: Self-pay | Admitting: Neurology

## 2016-03-13 ENCOUNTER — Ambulatory Visit (INDEPENDENT_AMBULATORY_CARE_PROVIDER_SITE_OTHER): Payer: Medicare Other | Admitting: Neurology

## 2016-03-13 VITALS — BP 107/72 | HR 62 | Ht 77.0 in | Wt 222.0 lb

## 2016-03-13 DIAGNOSIS — I6990 Unspecified sequelae of unspecified cerebrovascular disease: Secondary | ICD-10-CM

## 2016-03-13 NOTE — Patient Instructions (Signed)
I had a long d/w patient about his recent TIA, atrial fibrillation, risk for recurrent stroke/TIAs, personally independently reviewed imaging studies and stroke evaluation results and answered questions.Continue Eliquis (apixaban) daily  for secondary stroke prevention and maintain strict control of hypertension with blood pressure goal below 130/90, diabetes with hemoglobin A1c goal below 6.5% and lipids with LDL cholesterol goal below 70 mg/dL. I also advised the patient to eat a healthy diet with plenty of whole grains, cereals, fruits and vegetables, exercise regularly and maintain ideal body weight Followup in the future with me in one year or call earlier if necessary.

## 2016-03-13 NOTE — Progress Notes (Signed)
[facility] [address] [place] [address] [phone]       OFFICE FOLLOW-UP NOTE  [name] Date of Birth:  1941/07/28 Medical Record Number:  [id number]   HPI: 54 year Caucasian male seen today for the first office follow-up visit following hospital admission for TIA in October 2016.He had been doing well until last night 07/11/2015 around 8 PM (LKW) when he noted he was having some difficulty walking (not able to pick up either foot properly and tended to shuffle his feet). He went to sleep thinking he was tired. The next day he noted he was clumsy with his right hand (he is left handed) and noted some subjective decreased sensation in his right hand. Furthermore, his niece said that she thinks patient is having slurred speech since last night. Denies HA, vertigo, double vision, trouble swallowing, or vision impairment. Modified Rankin: Rankin Score=0. Patient was not administered TPA secondary to being out of the window. He was admitted for further evaluation and treatment. CT scan of the head on admission showed only mild chronic microvascular ischemic changes. MRI scan of the brain showed no acute infarct and and confirmed mild small vessel changes. MRA of the brain showed a focal severe stenosis of proximal right M2 branch and moderate multifocal atheromatous changes in the posterior cerebral arteries. Carotid Doppler showed no significant extracranial stenosis. Route extremity venous Doppler was negative for DVT. LDL cholesterol was elevated at 108. Hemoglobin A1c was 5.9. Patient was started on eliquis for his atrial fibrillation. In the past he had stop warfarin because of side effects and had been taking only aspirin despite a CHAD2VASc. score of 5. Patient was also found to have significant heart block and bradycardia and cardiology put in a pacemaker. 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 He states his returned back to work 2 weeks ago  and has had no problems. He still feels tired easily. He is a little bit disappointed that the pacemaker has not improved his overall stamina and thinking. He is tolerating eliquis well with only occasional bruising but no bleeding episodes. He is now gotten used to his pacemaker though he had a tough time in the first month .He is also tolerating Lipitor with only a few muscle aches which are not bothersome. 2 weeks ago he had severe abdominal pain and colic likely related to eating too much dry throat and chips. He is feeling much better now Update 03/13/2016.:He returns for follow-up after last visit 6 months ago. He continues to do well without recurrent stroke or TIA symptoms. He remains on eliquis which he is tolerating well without bleeding and only minor bruising. He states her blood pressure is well controlled at home and usually in the low 0000000 systolic range but today it is quite low in office at 107/72. She had and he had his lipid profile checked in May by Dr. Wynetta Emery family physician who reduced the dose of Lipitor from 20 to 10 mg as patient is complaining of myalgias. This appears to have improved and is also taking coenzyme q 10. He has no new complaints today. His remains quite active.  ROS:   14 system review of systems is positive for no complaints today and all other systems negative  PMH:  Past Medical History  Diagnosis Date  . Asthma     w seasonal rhinitis  . Sigmoid diverticulosis   . Hx of cardiovascular stress test     coronary  artery calcifications with normal nuclear study in 2010  . Tubulovillous adenoma polyp of colon   . Coronary artery calcification seen on CAT scan     nomal nuclear stress test  . Paroxysmal atrial flutter ([place])   . Stroke [facility])     TIA    Social History:  Social History   Social History  . Marital Status: Divorced    Spouse Name: N/A  . Number of Children: N/A  . Years of Education: N/A   Occupational History  . Not on file.    Social History Main Topics  . Smoking status: Never Smoker   . Smokeless tobacco: Not on file  . Alcohol Use: 0.6 oz/week    0 Standard drinks or equivalent, 1 Glasses of wine per week     Comment: once daily wine  . Drug Use: No  . Sexual Activity: Yes    Birth Control/ Protection: Condom   Other Topics Concern  . Not on file   Social History Narrative    Medications:   Current Outpatient Prescriptions on File Prior to Visit  Medication Sig Dispense Refill  . apixaban (ELIQUIS) 5 MG TABS tablet Take 1 tablet (5 mg total) by mouth 2 (two) times daily. Start on Monday-07/17/15 120 tablet 0  . atorvastatin (LIPITOR) 20 MG tablet Take 1 tablet (20 mg total) by mouth daily at 6 PM. (Patient taking differently: Take 10 mg by mouth daily at 6 PM. ) 90 tablet 0  . lisinopril (PRINIVIL,ZESTRIL) 20 MG tablet Take 1 tablet (20 mg total) by mouth daily. 90 tablet 0  . Multiple Vitamin (MULTIVITAMIN WITH MINERALS) TABS tablet Take 1 tablet by mouth daily.     No current facility-administered medications on file prior to visit.    Allergies:  No Known Allergies  Physical Exam General: well developed, well nourished elderly caucasian male, seated, in no evident distress Head: head normocephalic and atraumatic.  Neck: supple with no carotid or supraclavicular bruits Cardiovascular: regular rate and rhythm, no murmurs Musculoskeletal: no deformity Skin:  no rash/petichiae Vascular:  Normal pulses all extremities Filed Vitals:   03/13/16 1511  BP: 107/72  Pulse: 62   Neurologic Exam Mental Status: Awake and fully alert. Oriented to place and time. Recent and remote memory intact. Attention span, concentration and fund of knowledge appropriate. Mood and affect appropriate.  Cranial Nerves: Fundoscopic exam reveals sharp disc margins. Pupils equal, briskly reactive to light. Extraocular movements full without nystagmus. Visual fields full to confrontation. Hearing intact. Facial  sensation intact. Face, tongue, palate moves normally and symmetrically.  Motor: Normal bulk and tone. Normal strength in all tested extremity muscles. Sensory.: intact to touch ,pinprick .position and vibratory sensation.  Coordination: Rapid alternating movements normal in all extremities. Finger-to-nose and heel-to-shin performed accurately bilaterally. Gait and Station: Arises from chair without difficulty. Stance is normal. Gait demonstrates normal stride length and balance . Able to heel, toe and tandem walk without difficulty.  Reflexes: 1+ and symmetric. Toes downgoing.       ASSESSMENT: 21 year Caucasian male with left hemispheric TIA of cardiac embolic etiology due to atrial fibrillation/flutter in October 2016. Vascular risk factors of atrial fibrillation/flutter, hypertension, hyperlipidemia, coronary artery disease    PLAN: I had a long d/w patient about his recent TIA, atrial fibrillation, risk for recurrent stroke/TIAs, personally independently reviewed imaging studies and stroke evaluation results and answered questions.Continue Eliquis (apixaban) daily  for secondary stroke prevention and maintain strict control of hypertension with blood pressure goal  below 130/90, diabetes with hemoglobin A1c goal below 6.5% and lipids with LDL cholesterol goal below 70 mg/dL. I also advised the patient to eat a healthy diet with plenty of whole grains, cereals, fruits and vegetables, exercise regularly and maintain ideal body weight .Greater than 50% time during this 25 minute visit was spent on counseling and coordination of care of her atrial fibrillation and stroke risk Followup in the future with me in one year or call earlier if necessary.  Antony Contras, MD  Note: This document was prepared with digital dictation and possible smart phrase technology. Any transcriptional errors that result from this process are unintentional

## 2016-05-09 DIAGNOSIS — H0011 Chalazion right upper eyelid: Secondary | ICD-10-CM | POA: Diagnosis not present

## 2016-05-20 ENCOUNTER — Encounter: Payer: Medicare Other | Admitting: Cardiology

## 2016-05-23 ENCOUNTER — Encounter: Payer: Self-pay | Admitting: Cardiology

## 2016-05-23 ENCOUNTER — Ambulatory Visit (INDEPENDENT_AMBULATORY_CARE_PROVIDER_SITE_OTHER): Payer: Medicare Other | Admitting: Cardiology

## 2016-05-23 VITALS — BP 130/82 | HR 69 | Ht 77.0 in | Wt 224.6 lb

## 2016-05-23 DIAGNOSIS — I4819 Other persistent atrial fibrillation: Secondary | ICD-10-CM

## 2016-05-23 DIAGNOSIS — I481 Persistent atrial fibrillation: Secondary | ICD-10-CM | POA: Diagnosis not present

## 2016-05-23 LAB — CUP PACEART INCLINIC DEVICE CHECK
Brady Statistic AP VP Percent: 22.98 %
Brady Statistic AP VS Percent: 0.02 %
Brady Statistic AS VS Percent: 0.23 %
Brady Statistic RV Percent Paced: 99.75 %
Date Time Interrogation Session: 20170817164443
Implantable Lead Location: 753860
Implantable Lead Model: 5076
Lead Channel Impedance Value: 627 Ohm
Lead Channel Pacing Threshold Amplitude: 0.625 V
Lead Channel Sensing Intrinsic Amplitude: 0.25 mV
Lead Channel Sensing Intrinsic Amplitude: 12.75 mV
Lead Channel Setting Sensing Sensitivity: 4 mV
MDC IDC LEAD IMPLANT DT: 20161007
MDC IDC LEAD IMPLANT DT: 20161007
MDC IDC LEAD LOCATION: 753859
MDC IDC MSMT BATTERY REMAINING LONGEVITY: 81 mo
MDC IDC MSMT BATTERY VOLTAGE: 3 V
MDC IDC MSMT LEADCHNL RA IMPEDANCE VALUE: 323 Ohm
MDC IDC MSMT LEADCHNL RA IMPEDANCE VALUE: 437 Ohm
MDC IDC MSMT LEADCHNL RV IMPEDANCE VALUE: 475 Ohm
MDC IDC MSMT LEADCHNL RV PACING THRESHOLD PULSEWIDTH: 0.4 ms
MDC IDC SET LEADCHNL RA PACING AMPLITUDE: 2 V
MDC IDC SET LEADCHNL RV PACING AMPLITUDE: 2 V
MDC IDC SET LEADCHNL RV PACING PULSEWIDTH: 0.4 ms
MDC IDC STAT BRADY AS VP PERCENT: 76.77 %
MDC IDC STAT BRADY RA PERCENT PACED: 23 %

## 2016-05-23 NOTE — Patient Instructions (Signed)
Medication Instructions:  Your physician recommends that you continue on your current medications as directed. Please refer to the Current Medication list given to you today.   Labwork: None ordered   Testing/Procedures: None ordered   Follow-Up: Your physician wants you to follow-up in: 12 months with Dr Cora Daniels will receive a reminder letter in the mail two months in advance. If you don't receive a letter, please call our office to schedule the follow-up appointment.   Remote monitoring is used to monitor your Pacemaker from home. This monitoring reduces the number of office visits required to check your device to one time per year. It allows [place] to keep an eye on the functioning of your device to ensure it is working properly. You are scheduled for a device check from home on 08/22/16. You may send your transmission at any time that day. If you have a wireless device, the transmission will be sent automatically. After your physician reviews your transmission, you will receive a postcard with your next transmission date.     Any Other Special Instructions Will Be Listed Below (If Applicable).     If you need a refill on your cardiac medications before your next appointment, please call your pharmacy.

## 2016-05-23 NOTE — Progress Notes (Signed)
Electrophysiology Office Note   Date:  05/23/2016   ID:  Duane Mercado, DOB Oct 28, 1940, MRN [id number]  PCP:  [name], MD  Cardiologist:  Fransico Him Primary Electrophysiologist:  Phoebe Marter Meredith Leeds, MD    Chief Complaint  Patient presents with  . Pacemaker Check     History of Present Illness: Duane Mercado is a 75 y.o. male who presents today for electrophysiology evaluation.  He has a history of coronary calcification on CT with a negative Myoview, sleep apnea not on CPAP, atrial flutter status post multiple cardioversions in 2003 and 2007. On the night of 07/11/15 he had significant fatigue and felt very imbalanced. He noticed slightly worsening right-sided m weakness when he walked. His speech was also slurred according to his family. He was brought to [facility] for further evaluation. He was noted to have atrial fibrillation of unknown duration and a slow ventricular response in the 30s. At that hospitalization he had a dual chamber pacemaker placed. Neurology at that time could not rule out MRI negative cerebral ischemia. He was placed on Eliquis at the time due to his TIA.   Today, he denies symptoms of palpitations, chest pain, shortness of breath, orthopnea, PND, lower extremity edema, claudication, dizziness, presyncope, syncope, bleeding, or neurologic sequela. The patient is tolerating medications without difficulties and is otherwise without complaint today.  He has been feeling well without major issues. He says that he occasionally gets fatigued around the time that he has lunch. He continues to work a full schedule in his [employer]. He says that after a few hours, he feels better. He thinks that this is just post prandial fatigue.   Past Medical History:  Diagnosis Date  . Asthma    w seasonal rhinitis  . Coronary artery calcification seen on CAT scan    nomal nuclear stress test  . Hx of cardiovascular stress test    coronary artery calcifications with  normal nuclear study in 2010  . Paroxysmal atrial flutter ([place])   . Sigmoid diverticulosis   . Stroke [facility])    TIA  . Tubulovillous adenoma polyp of colon    Past Surgical History:  Procedure Laterality Date  . EP IMPLANTABLE DEVICE N/A 07/13/2015   Procedure: Pacemaker Implant;  Surgeon: Naama Sappington Meredith Leeds, MD;  Location: [facility];  Service: Cardiovascular;  Laterality: N/A;  . tubulovillous adenomatous  12/2008   polyps removed colonoscopically     Current Outpatient Prescriptions  Medication Sig Dispense Refill  . apixaban (ELIQUIS) 5 MG TABS tablet Take 1 tablet (5 mg total) by mouth 2 (two) times daily. Start on Monday-07/17/15 120 tablet 0  . atorvastatin (LIPITOR) 20 MG tablet Take 10 mg by mouth daily. Take one-half tablet daily    . Coenzyme Q10 (COQ10 PO) Take 400 mg by mouth.    [name] lisinopril (PRINIVIL,ZESTRIL) 20 MG tablet Take 1 tablet (20 mg total) by mouth daily. 90 tablet 0  . Multiple Vitamin (MULTIVITAMIN WITH MINERALS) TABS tablet Take 1 tablet by mouth daily.     No current facility-administered medications for this visit.     Allergies:   Review of patient's allergies indicates no known allergies.   Social History:  The patient  reports that he has never smoked. He does not have any smokeless tobacco history on file. He reports that he drinks about 0.6 oz of alcohol per week . He reports that he does not use drugs.   Family History:  The patient's family history  includes Heart disease in his father; Hypertension in his father and mother; Melanoma in his father.    ROS:  Please see the history of present illness.   Otherwise, review of systems is positive for muscle pain, chest pressure.   All other systems are reviewed and negative.    PHYSICAL EXAM: VS:  BP 130/82   Pulse 69   Ht 6\' 5"  (1.956 m)   Wt 224 lb 9.6 oz (101.9 kg)   BMI 26.63 kg/m  , BMI Body mass index is 26.63 kg/m. GEN: Well nourished, well developed, in no acute distress    HEENT: normal  Neck: no JVD, carotid bruits, or masses Cardiac: RRR; no murmurs, rubs, or gallops,no edema  Respiratory:  clear to auscultation bilaterally, normal work of breathing GI: soft, nontender, nondistended, + BS MS: no deformity or atrophy  Skin: warm and dry,  device pocket is well healed Neuro:  Strength and sensation are intact Psych: euthymic mood, full affect  EKG:  EKG is ordered today. Personal review of the ekg ordered today shows V paced, atrial flutter, rate 69  Device interrogation is reviewed today in detail.  See PaceArt for details.   Recent Labs: 07/12/2015: ALT 26; BUN 18; Creatinine, Ser 0.84; Hemoglobin 14.7; Platelets 151; Potassium 4.0; Sodium 140    Lipid Panel     Component Value Date/Time   CHOL 167 07/13/2015 0704   TRIG 64 07/13/2015 0704   HDL 46 07/13/2015 0704   CHOLHDL 3.6 07/13/2015 0704   VLDL 13 07/13/2015 0704   LDLCALC 108 (H) 07/13/2015 0704     Wt Readings from Last 3 Encounters:  05/23/16 224 lb 9.6 oz (101.9 kg)  03/13/16 222 lb (100.7 kg)  10/13/15 218 lb 3.2 oz (99 kg)      Other studies Reviewed: Additional studies/ records that were reviewed today include: TTE 07/13/15 Review of the above records today demonstrates:  - Left ventricle: The cavity size was normal. Wall thickness was increased in a pattern of moderate LVH. Systolic function was normal. The estimated ejection fraction was in the range of 50% to 55%. Wall motion was normal; there were no regional wall motion abnormalities. - Aortic valve: There was mild regurgitation. - Left atrium: The atrium was moderately dilated. - Right atrium: The atrium was moderately dilated.   ASSESSMENT AND PLAN:  1.  Complete heart block: had dual chamber pacemaker placed on 07-13-15 without issues.  Pacemaker working without any issues.  No changes necessary today.  2. Persistent atrial fibrillation: CHADS2VASC of 4.  He is on Eliquis and we Merlin Golden continue that  medication.  Currently in atypical atrial flutter.  I did offer him the option of returning back to sinus rhythm. He says that since he is feeling well he does not feel like this is necessary at the moment. He Jontavius Rabalais call us back with any further thoughts on the subject.  Of note he went to a dental conference and had a carotid intimal thickness ultrasound done he Tola Meas send [place] the results of that.  Current medicines are reviewed at length with the patient today.   The patient does not have concerns regarding his medicines.  The following changes were made today:  none  Labs/ tests ordered today include:  Orders Placed This Encounter  Procedures  . Implantable device check     Disposition:   FU with Dailee Manalang 1 year  Signed, Keriana Sarsfield Meredith Leeds, MD is 05/23/2016 4:46 PM     [facility]  [address] [place] [address] [address] [phone] (office) [phone] (fax)

## 2016-06-06 DIAGNOSIS — H0012 Chalazion right lower eyelid: Secondary | ICD-10-CM | POA: Diagnosis not present

## 2016-08-22 ENCOUNTER — Ambulatory Visit (INDEPENDENT_AMBULATORY_CARE_PROVIDER_SITE_OTHER): Payer: Medicare Other | Admitting: *Deleted

## 2016-08-22 ENCOUNTER — Telehealth: Payer: Self-pay | Admitting: Cardiology

## 2016-08-22 DIAGNOSIS — I442 Atrioventricular block, complete: Secondary | ICD-10-CM | POA: Diagnosis not present

## 2016-08-22 NOTE — Telephone Encounter (Signed)
LMOVM reminding pt to send remote transmission.    Confirmed remote transmission w/ pt son.

## 2016-08-23 NOTE — Progress Notes (Signed)
Remote pacemaker transmission.   

## 2016-08-28 ENCOUNTER — Encounter: Payer: Self-pay | Admitting: Cardiology

## 2016-08-31 LAB — CUP PACEART REMOTE DEVICE CHECK
Brady Statistic AP VP Percent: 9.95 %
Brady Statistic AP VS Percent: 0.01 %
Brady Statistic AS VP Percent: 89.8 %
Brady Statistic AS VS Percent: 0.25 %
Implantable Lead Implant Date: 20161007
Implantable Lead Location: 753860
Implantable Lead Model: 5076
Lead Channel Impedance Value: 323 Ohm
Lead Channel Impedance Value: 494 Ohm
Lead Channel Pacing Threshold Amplitude: 0.625 V
Lead Channel Sensing Intrinsic Amplitude: 12.375 mV
Lead Channel Sensing Intrinsic Amplitude: 12.375 mV
Lead Channel Setting Pacing Amplitude: 2 V
Lead Channel Setting Pacing Pulse Width: 0.4 ms
MDC IDC LEAD IMPLANT DT: 20161007
MDC IDC LEAD LOCATION: 753859
MDC IDC MSMT BATTERY REMAINING LONGEVITY: 81 mo
MDC IDC MSMT BATTERY VOLTAGE: 3 V
MDC IDC MSMT LEADCHNL RA IMPEDANCE VALUE: 437 Ohm
MDC IDC MSMT LEADCHNL RA SENSING INTR AMPL: 0.125 mV
MDC IDC MSMT LEADCHNL RA SENSING INTR AMPL: 0.125 mV
MDC IDC MSMT LEADCHNL RV IMPEDANCE VALUE: 627 Ohm
MDC IDC MSMT LEADCHNL RV PACING THRESHOLD PULSEWIDTH: 0.4 ms
MDC IDC PG IMPLANT DT: 20161007
MDC IDC SESS DTM: 20171117033458
MDC IDC SET LEADCHNL RV PACING AMPLITUDE: 2 V
MDC IDC SET LEADCHNL RV SENSING SENSITIVITY: 4 mV
MDC IDC STAT BRADY RA PERCENT PACED: 9.95 %
MDC IDC STAT BRADY RV PERCENT PACED: 99.74 %

## 2016-09-12 ENCOUNTER — Other Ambulatory Visit: Payer: Self-pay | Admitting: Gastroenterology

## 2016-09-18 ENCOUNTER — Encounter: Payer: Self-pay | Admitting: Cardiology

## 2016-09-24 ENCOUNTER — Encounter (HOSPITAL_COMMUNITY): Payer: Self-pay

## 2016-09-25 ENCOUNTER — Telehealth: Payer: Self-pay | Admitting: Cardiology

## 2016-09-25 NOTE — Telephone Encounter (Signed)
Anderson Malta w/ [facility] returns my call.  Pt states he is supposed to be going for this procedure on 12/26 and his son is scheduled for tomorrow.  Anderson Malta is going to contact ordering physician for clarification of colonoscopy date.  She will call me back to determine when clearance is needed.

## 2016-09-25 NOTE — Telephone Encounter (Signed)
Duane Mercado, from hospital, calls me back informing me pt's colonoscopy is not tomorrow, his son's is.  They are unsure when his is to be performed.  Informed her I would document clearance for colonoscopy from Dr. Curt Bears.  Pt should hold Eliquis 48 hr prior to procedure.

## 2016-09-25 NOTE — Telephone Encounter (Signed)
New Message  Duane Mercado from [place] voiced a fax was sent over yesterday regarding procedure pt is having tomorrow and would like for document to be faxed back before tomorrow.  Duane Mercado voiced pt is having a colonoscopy.  Please f/u

## 2016-09-26 ENCOUNTER — Other Ambulatory Visit: Payer: Self-pay | Admitting: Gastroenterology

## 2016-09-26 ENCOUNTER — Ambulatory Visit (HOSPITAL_COMMUNITY): Admission: RE | Admit: 2016-09-26 | Payer: Medicare Other | Source: Ambulatory Visit | Admitting: Gastroenterology

## 2016-09-26 ENCOUNTER — Encounter (HOSPITAL_COMMUNITY): Admission: RE | Payer: Self-pay | Source: Ambulatory Visit

## 2016-09-26 HISTORY — DX: Presence of cardiac pacemaker: Z95.0

## 2016-09-26 SURGERY — COLONOSCOPY WITH PROPOFOL
Anesthesia: Monitor Anesthesia Care

## 2016-09-27 ENCOUNTER — Encounter (HOSPITAL_COMMUNITY): Payer: Self-pay

## 2016-09-30 NOTE — Anesthesia Preprocedure Evaluation (Addendum)
Anesthesia Evaluation  Patient identified by MRN, date of birth, ID band Patient awake    Reviewed: Allergy & Precautions, NPO status , Patient's Chart, lab work & pertinent test results  History of Anesthesia Complications Negative for: history of anesthetic complications  Airway Mallampati: II  TM Distance: >3 FB Neck ROM: Full    Dental no notable dental hx. (+) Dental Advisory Given   Pulmonary asthma ,    Pulmonary exam normal        Cardiovascular negative cardio ROS Normal cardiovascular exam+ dysrhythmias + pacemaker   Study Conclusions  - Left ventricle: The cavity size was normal. Wall thickness was   increased in a pattern of moderate LVH. Systolic function was   normal. The estimated ejection fraction was in the range of 50%   to 55%. Wall motion was normal; there were no regional wall   motion abnormalities.   Neuro/Psych TIAnegative psych ROS   GI/Hepatic negative GI ROS, Neg liver ROS,   Endo/Other  negative endocrine ROS  Renal/GU negative Renal ROS     Musculoskeletal negative musculoskeletal ROS (+)   Abdominal   Peds  Hematology negative hematology ROS (+)   Anesthesia Other Findings Day of surgery medications reviewed with the patient.  Reproductive/Obstetrics                            Anesthesia Physical Anesthesia Plan  ASA: III  Anesthesia Plan: MAC   Post-op Pain Management:    Induction:   Airway Management Planned: Natural Airway  Additional Equipment:   Intra-op Plan:   Post-operative Plan:   Informed Consent:   Plan Discussed with:   Anesthesia Plan Comments:         Anesthesia Quick Evaluation

## 2016-10-01 ENCOUNTER — Ambulatory Visit (HOSPITAL_COMMUNITY)
Admission: RE | Admit: 2016-10-01 | Discharge: 2016-10-01 | Disposition: A | Discharge status: Home / Self Care | Payer: Medicare Other | Source: Ambulatory Visit | Attending: Gastroenterology | Admitting: Gastroenterology

## 2016-10-01 ENCOUNTER — Ambulatory Visit (HOSPITAL_COMMUNITY): Payer: Medicare Other | Admitting: Anesthesiology

## 2016-10-01 ENCOUNTER — Encounter (HOSPITAL_COMMUNITY)
Admission: RE | Disposition: A | Discharge status: Home / Self Care | Payer: Self-pay | Source: Ambulatory Visit | Attending: Gastroenterology

## 2016-10-01 ENCOUNTER — Encounter (HOSPITAL_COMMUNITY): Payer: Self-pay

## 2016-10-01 DIAGNOSIS — Z86010 Personal history of colonic polyps: Secondary | ICD-10-CM | POA: Insufficient documentation

## 2016-10-01 DIAGNOSIS — Z8673 Personal history of transient ischemic attack (TIA), and cerebral infarction without residual deficits: Secondary | ICD-10-CM | POA: Diagnosis not present

## 2016-10-01 DIAGNOSIS — I2510 Atherosclerotic heart disease of native coronary artery without angina pectoris: Secondary | ICD-10-CM | POA: Insufficient documentation

## 2016-10-01 DIAGNOSIS — G4733 Obstructive sleep apnea (adult) (pediatric): Secondary | ICD-10-CM | POA: Insufficient documentation

## 2016-10-01 DIAGNOSIS — K5730 Diverticulosis of large intestine without perforation or abscess without bleeding: Secondary | ICD-10-CM | POA: Diagnosis not present

## 2016-10-01 DIAGNOSIS — I10 Essential (primary) hypertension: Secondary | ICD-10-CM | POA: Insufficient documentation

## 2016-10-01 DIAGNOSIS — E7800 Pure hypercholesterolemia, unspecified: Secondary | ICD-10-CM | POA: Insufficient documentation

## 2016-10-01 DIAGNOSIS — Z950 Presence of cardiac pacemaker: Secondary | ICD-10-CM | POA: Insufficient documentation

## 2016-10-01 DIAGNOSIS — I4891 Unspecified atrial fibrillation: Secondary | ICD-10-CM | POA: Insufficient documentation

## 2016-10-01 DIAGNOSIS — Z1211 Encounter for screening for malignant neoplasm of colon: Secondary | ICD-10-CM | POA: Diagnosis not present

## 2016-10-01 DIAGNOSIS — J45909 Unspecified asthma, uncomplicated: Secondary | ICD-10-CM | POA: Diagnosis not present

## 2016-10-01 DIAGNOSIS — G459 Transient cerebral ischemic attack, unspecified: Secondary | ICD-10-CM | POA: Diagnosis not present

## 2016-10-01 HISTORY — PX: COLONOSCOPY WITH PROPOFOL: SHX5780

## 2016-10-01 SURGERY — COLONOSCOPY WITH PROPOFOL
Anesthesia: Monitor Anesthesia Care

## 2016-10-01 MED ORDER — LIDOCAINE 2% (20 MG/ML) 5 ML SYRINGE
INTRAMUSCULAR | Status: DC | PRN
  Administered 2016-10-01: 100 mg via INTRAVENOUS

## 2016-10-01 MED ORDER — PROPOFOL 10 MG/ML IV BOLUS
INTRAVENOUS | Status: AC
  Filled 2016-10-01: qty 40

## 2016-10-01 MED ORDER — LIDOCAINE 2% (20 MG/ML) 5 ML SYRINGE
INTRAMUSCULAR | Status: AC
  Filled 2016-10-01: qty 5

## 2016-10-01 MED ORDER — LACTATED RINGERS IV SOLN
INTRAVENOUS | Status: DC | PRN
  Administered 2016-10-01: 08:00:00 via INTRAVENOUS

## 2016-10-01 MED ORDER — PROPOFOL 500 MG/50ML IV EMUL
INTRAVENOUS | Status: DC | PRN
  Administered 2016-10-01: 140 ug/kg/min via INTRAVENOUS

## 2016-10-01 MED ORDER — PROPOFOL 10 MG/ML IV BOLUS
INTRAVENOUS | Status: DC | PRN
  Administered 2016-10-01 (×2): 20 mg via INTRAVENOUS

## 2016-10-01 MED ORDER — PROPOFOL 10 MG/ML IV BOLUS
INTRAVENOUS | Status: AC
  Filled 2016-10-01: qty 20

## 2016-10-01 SURGICAL SUPPLY — 21 items

## 2016-10-01 NOTE — H&P (Signed)
Procedure: Surveillance colonoscopy. 12/16/2008 screening colonoscopy was performed with removal of a 15 mm pedunculated tubulovillous distal sigmoid colon polyp and two 5 mm tubulovillous rectal polyps  History: The patient is a 75 year old male born 1940-11-01. He is scheduled to undergo a surveillance colonoscopy today.  Past medical history: Atrial fibrillation complicated by transient ischemic attack and complete heart block. Cardiac pacemaker placed. Chronic eliquis anticoagulation. Mild aortic valve regurgitation. Obstructive sleep apnea. Coronary artery calcification seen on CT scan. Normal. Medicine stress test performed in 2010. Hypertension. Hypercholesterolemia. Billroth I gastrectomy performed to treat perforated ulcer. Tonsillectomy. Bilateral inguinal herniorrhaphies. Cardiac pacemaker placed in October 2016.  Medication allergies: None  Exam: The patient is alert and lying comfortably on the endoscopy stretcher. Abdomen is soft and nontender to palpation. Cardiac exam reveals a regular rhythm. Lungs are clear to auscultation.  Plan: Proceed with surveillance colonoscopy

## 2016-10-01 NOTE — Discharge Instructions (Signed)

## 2016-10-01 NOTE — Anesthesia Postprocedure Evaluation (Signed)
Anesthesia Post Note  Patient: Duane Mercado  Procedure(s) Performed: Procedure(s) (LRB): COLONOSCOPY WITH PROPOFOL (N/A)  Patient location during evaluation: PACU Anesthesia Type: MAC Level of consciousness: awake and alert Pain management: pain level controlled Vital Signs Assessment: post-procedure vital signs reviewed and stable Respiratory status: spontaneous breathing and respiratory function stable Cardiovascular status: stable Anesthetic complications: no       Last Vitals:  Vitals:   10/01/16 0737 10/01/16 0824  BP:  115/66  Pulse:  62  Resp:  10  Temp: 36.7 C 36.1 C    Last Pain:  Vitals:   10/01/16 0824  TempSrc: Axillary                 Alyiah Ulloa DANIEL

## 2016-10-01 NOTE — Transfer of Care (Signed)
Immediate Anesthesia Transfer of Care Note  Patient: Duane Mercado  Procedure(s) Performed: Procedure(s): COLONOSCOPY WITH PROPOFOL (N/A)  Patient Location: PACU and Endoscopy Unit  Anesthesia Type:MAC  Level of Consciousness: awake, alert , oriented and patient cooperative  Airway & Oxygen Therapy: Patient Spontanous Breathing and Patient connected to face mask oxygen  Post-op Assessment: Report given to RN, Post -op Vital signs reviewed and stable and Patient moving all extremities  Post vital signs: Reviewed and stable  Last Vitals:  Vitals:   10/01/16 0733 10/01/16 0737  BP: (!) 186/112   Pulse: 74   Resp: 14   Temp:  36.7 C    Last Pain: There were no vitals filed for this visit.       Complications: No apparent anesthesia complications

## 2016-10-01 NOTE — Op Note (Signed)
[facility] Patient Name: Duane Mercado Procedure Date: 10/01/2016 MRN: [id number] Attending MD: [name] , MD Date of Birth: [birth year] CSN: [id number] Age: 75 Admit Type: Inpatient Procedure:                Colonoscopy Indications:              High risk colon cancer surveillance: Personal                            history of adenoma with villous component Providers:                [name], MD, Cleda Daub, RN, [facility], Technician, Bella Kennedy A. Armistead, CRNA Referring MD:              Medicines:                Propofol per Anesthesia Complications:            No immediate complications. Estimated Blood Loss:     Estimated blood loss: none. Procedure:                Pre-Anesthesia Assessment:                           - Prior to the procedure, a History and Physical                            was performed, and patient medications and                            allergies were reviewed. The patient's tolerance of                            previous anesthesia was also reviewed. The risks                            and benefits of the procedure and the sedation                            options and risks were discussed with the patient.                            All questions were answered, and informed consent                            was obtained. Prior Anticoagulants: The patient has                            taken Eliquis (apixaban), last dose was 4 days                            prior to procedure. ASA Grade Assessment: III - A  patient with severe systemic disease. After                            reviewing the risks and benefits, the patient was                            deemed in satisfactory condition to undergo the                            procedure.                           After obtaining informed consent, the colonoscope                            was passed under  direct vision. Throughout the                            procedure, the patient's blood pressure, pulse, and                            oxygen saturations were monitored continuously. The                            [id number]) scope was introduced through                            the anus and advanced to the the cecum, identified                            by appendiceal orifice and ileocecal valve. The                            colonoscopy was performed without difficulty. The                            patient tolerated the procedure well. The quality                            of the bowel preparation was good. The ileocecal                            valve, the appendiceal orifice and the rectum were                            photographed. Scope In: 7:54:08 AM Scope Out: 8:18:16 AM Scope Withdrawal Time: 0 hours 14 minutes 55 seconds  Total Procedure Duration: 0 hours 24 minutes 8 seconds  Findings:      The perianal and digital rectal examinations were normal.      The entire examined colon appeared normal. Left colonic diverticulosis       was present. Impression:               - The entire examined colon is normal.                           -  No specimens collected. Moderate Sedation:      N/A- Per Anesthesia Care Recommendation:           - Patient has a contact number available for                            emergencies. The signs and symptoms of potential                            delayed complications were discussed with the                            patient. Return to normal activities tomorrow.                            Written discharge instructions were provided to the                            patient.                           - Repeat colonoscopy is not recommended for                            surveillance.                           - Resume previous diet.                           - Continue present medications. Procedure Code(s):        ---  Professional ---                           [id number], Colorectal cancer screening; colonoscopy on                            individual at high risk Diagnosis Code(s):        --- Professional ---                           Z86.010, Personal history of colonic polyps CPT copyright 2016 [employer]. All rights reserved. The codes documented in this report are preliminary and upon coder review may  be revised to meet current compliance requirements. Earle Gell, MD [name], MD 10/01/2016 8:28:01 AM This report has been signed electronically. Number of Addenda: 0

## 2016-10-02 ENCOUNTER — Encounter (HOSPITAL_COMMUNITY): Payer: Self-pay | Admitting: Gastroenterology

## 2016-10-30 DIAGNOSIS — D225 Melanocytic nevi of trunk: Secondary | ICD-10-CM | POA: Diagnosis not present

## 2016-10-30 DIAGNOSIS — L570 Actinic keratosis: Secondary | ICD-10-CM | POA: Diagnosis not present

## 2016-10-30 DIAGNOSIS — D1801 Hemangioma of skin and subcutaneous tissue: Secondary | ICD-10-CM | POA: Diagnosis not present

## 2016-10-30 DIAGNOSIS — L821 Other seborrheic keratosis: Secondary | ICD-10-CM | POA: Diagnosis not present

## 2016-11-22 ENCOUNTER — Ambulatory Visit (INDEPENDENT_AMBULATORY_CARE_PROVIDER_SITE_OTHER): Payer: Medicare Other | Admitting: *Deleted

## 2016-11-22 DIAGNOSIS — I442 Atrioventricular block, complete: Secondary | ICD-10-CM

## 2016-11-25 NOTE — Progress Notes (Signed)
Remote pacemaker transmission.   

## 2016-11-26 ENCOUNTER — Encounter: Payer: Self-pay | Admitting: Cardiology

## 2016-11-28 ENCOUNTER — Encounter: Payer: Self-pay | Admitting: Cardiology

## 2016-11-29 LAB — CUP PACEART REMOTE DEVICE CHECK
Brady Statistic AS VP Percent: 88.7 %
Brady Statistic RA Percent Paced: 10.52 %
Brady Statistic RV Percent Paced: 99.75 %
Date Time Interrogation Session: 20180216113336
Implantable Lead Implant Date: 20161007
Implantable Lead Location: 753860
Implantable Lead Model: 5076
Implantable Pulse Generator Implant Date: 20161007
Lead Channel Impedance Value: 456 Ohm
Lead Channel Impedance Value: 608 Ohm
Lead Channel Sensing Intrinsic Amplitude: 0.25 mV
Lead Channel Sensing Intrinsic Amplitude: 13 mV
Lead Channel Setting Sensing Sensitivity: 4 mV
MDC IDC LEAD IMPLANT DT: 20161007
MDC IDC LEAD LOCATION: 753859
MDC IDC MSMT BATTERY REMAINING LONGEVITY: 80 mo
MDC IDC MSMT BATTERY VOLTAGE: 3 V
MDC IDC MSMT LEADCHNL RA IMPEDANCE VALUE: 323 Ohm
MDC IDC MSMT LEADCHNL RA SENSING INTR AMPL: 0.25 mV
MDC IDC MSMT LEADCHNL RV IMPEDANCE VALUE: 494 Ohm
MDC IDC MSMT LEADCHNL RV PACING THRESHOLD AMPLITUDE: 0.625 V
MDC IDC MSMT LEADCHNL RV PACING THRESHOLD PULSEWIDTH: 0.4 ms
MDC IDC MSMT LEADCHNL RV SENSING INTR AMPL: 13 mV
MDC IDC SET LEADCHNL RA PACING AMPLITUDE: 2 V
MDC IDC SET LEADCHNL RV PACING AMPLITUDE: 2 V
MDC IDC SET LEADCHNL RV PACING PULSEWIDTH: 0.4 ms
MDC IDC STAT BRADY AP VP PERCENT: 11.06 %
MDC IDC STAT BRADY AP VS PERCENT: 0 %
MDC IDC STAT BRADY AS VS PERCENT: 0.24 %

## 2017-01-09 DIAGNOSIS — Z0000 Encounter for general adult medical examination without abnormal findings: Secondary | ICD-10-CM | POA: Diagnosis not present

## 2017-01-09 DIAGNOSIS — Z23 Encounter for immunization: Secondary | ICD-10-CM | POA: Diagnosis not present

## 2017-01-09 DIAGNOSIS — Z1389 Encounter for screening for other disorder: Secondary | ICD-10-CM | POA: Diagnosis not present

## 2017-01-31 DIAGNOSIS — H2513 Age-related nuclear cataract, bilateral: Secondary | ICD-10-CM | POA: Diagnosis not present

## 2017-01-31 DIAGNOSIS — H524 Presbyopia: Secondary | ICD-10-CM | POA: Diagnosis not present

## 2017-02-24 ENCOUNTER — Telehealth: Payer: Self-pay | Admitting: Cardiology

## 2017-02-24 ENCOUNTER — Ambulatory Visit (INDEPENDENT_AMBULATORY_CARE_PROVIDER_SITE_OTHER): Payer: Medicare Other | Admitting: *Deleted

## 2017-02-24 DIAGNOSIS — I442 Atrioventricular block, complete: Secondary | ICD-10-CM

## 2017-02-24 NOTE — Telephone Encounter (Signed)
LMOVM reminding pt to send remote transmission.   

## 2017-02-25 LAB — CUP PACEART REMOTE DEVICE CHECK
Battery Remaining Longevity: 76 mo
Battery Voltage: 3 V
Brady Statistic AP VP Percent: 7.52 %
Brady Statistic RA Percent Paced: 7.27 %
Brady Statistic RV Percent Paced: 99.44 %
Implantable Lead Implant Date: 20161007
Implantable Lead Location: 753860
Implantable Lead Model: 5076
Implantable Lead Model: 5076
Implantable Pulse Generator Implant Date: 20161007
Lead Channel Impedance Value: 323 Ohm
Lead Channel Impedance Value: 513 Ohm
Lead Channel Impedance Value: 608 Ohm
Lead Channel Pacing Threshold Pulse Width: 0.4 ms
Lead Channel Sensing Intrinsic Amplitude: 0.25 mV
Lead Channel Sensing Intrinsic Amplitude: 0.25 mV
Lead Channel Sensing Intrinsic Amplitude: 13.625 mV
Lead Channel Setting Pacing Amplitude: 2 V
MDC IDC LEAD IMPLANT DT: 20161007
MDC IDC LEAD LOCATION: 753859
MDC IDC MSMT LEADCHNL RA IMPEDANCE VALUE: 456 Ohm
MDC IDC MSMT LEADCHNL RV PACING THRESHOLD AMPLITUDE: 0.5 V
MDC IDC MSMT LEADCHNL RV SENSING INTR AMPL: 13.625 mV
MDC IDC SESS DTM: 20180521224036
MDC IDC SET LEADCHNL RV PACING AMPLITUDE: 2 V
MDC IDC SET LEADCHNL RV PACING PULSEWIDTH: 0.4 ms
MDC IDC SET LEADCHNL RV SENSING SENSITIVITY: 4 mV
MDC IDC STAT BRADY AP VS PERCENT: 0.01 %
MDC IDC STAT BRADY AS VP PERCENT: 92 %
MDC IDC STAT BRADY AS VS PERCENT: 0.48 %

## 2017-02-25 NOTE — Progress Notes (Signed)
Remote pacemaker transmission.   

## 2017-02-26 ENCOUNTER — Encounter: Payer: Self-pay | Admitting: Cardiology

## 2017-03-12 ENCOUNTER — Encounter: Payer: Self-pay | Admitting: Cardiology

## 2017-03-12 ENCOUNTER — Encounter: Payer: Self-pay | Admitting: Neurology

## 2017-03-12 ENCOUNTER — Ambulatory Visit (INDEPENDENT_AMBULATORY_CARE_PROVIDER_SITE_OTHER): Payer: Medicare Other | Admitting: Neurology

## 2017-03-12 VITALS — BP 133/87 | HR 64 | Wt 230.4 lb

## 2017-03-12 DIAGNOSIS — I6990 Unspecified sequelae of unspecified cerebrovascular disease: Secondary | ICD-10-CM | POA: Diagnosis not present

## 2017-03-12 NOTE — Progress Notes (Signed)
[facility] [address] [place]. [place] [address] [phone]       OFFICE FOLLOW-UP NOTE  [name] Date of Birth:  [birth year] Medical Record Number:  [id number]   HPI: 87 year Caucasian male seen today for the first office follow-up visit following hospital admission for TIA in October 2016.He had been doing well until last night 07/11/2015 around 8 PM (LKW) when he noted he was having some difficulty walking (not able to pick up either foot properly and tended to shuffle his feet). He went to sleep thinking he was tired. The next day he noted he was clumsy with his right hand (he is left handed) and noted some subjective decreased sensation in his right hand. Furthermore, his niece said that she thinks patient is having slurred speech since last night. Denies HA, vertigo, double vision, trouble swallowing, or vision impairment. Modified Rankin: Rankin Score=0. Patient was not administered TPA secondary to being out of the window. He was admitted for further evaluation and treatment. CT scan of the head on admission showed only mild chronic microvascular ischemic changes. MRI scan of the brain showed no acute infarct and and confirmed mild small vessel changes. MRA of the brain showed a focal severe stenosis of proximal right M2 branch and moderate multifocal atheromatous changes in the posterior cerebral arteries. Carotid Doppler showed no significant extracranial stenosis. Route extremity venous Doppler was negative for DVT. LDL cholesterol was elevated at 108. Hemoglobin A1c was 5.9. Patient was started on eliquis for his atrial fibrillation. In the past he had stop warfarin because of side effects and had been taking only aspirin despite a CHAD2VASc. score of 5. Patient was also found to have significant heart block and bradycardia and cardiology put in a pacemaker. 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 He states his returned back to work 2 weeks ago  and has had no problems. He still feels tired easily. He is a little bit disappointed that the pacemaker has not improved his overall stamina and thinking. He is tolerating eliquis well with only occasional bruising but no bleeding episodes. He is now gotten used to his pacemaker though he had a tough time in the first month .He is also tolerating Lipitor with only a few muscle aches which are not bothersome. 2 weeks ago he had severe abdominal pain and colic likely related to eating too much dry throat and chips. He is feeling much better now Update 03/13/2016.:He returns for follow-up after last visit 6 months ago. He continues to do well without recurrent stroke or TIA symptoms. He remains on eliquis which he is tolerating well without bleeding and only minor bruising. He states her blood pressure is well controlled at home and usually in the low 735H systolic range but today it is quite low in office at 107/72. She had and he had his lipid profile checked in May by Dr. Wynetta Emery family physician who reduced the dose of Lipitor from 20 to 10 mg as patient is complaining of myalgias. This appears to have improved and is also taking coenzyme q 10. He has no new complaints today. His remains quite active. Update 03/13/2017: He returns for follow-up after last visit a year ago. Continues to well without recurrent stroke or TIA symptoms. Remains on eliquis which is tolerating well with only minor bruising but no major bleeding episodes. His blood pressure remains well controlled and today it is 133/87. He states his last lipid profile checked in  the fall of 2017 was quite low and hence primary care physician reduced the dose of Lipitor to 10 mg daily. He still having some muscle aches and pains but does take CoQ10 and is not sure if it helps. He did have a colonoscopy last December was fine. He has no new neurological complaints. He has not had a follow-up carotid ultrasound done since this stroke couple of years  ago. ROS:   14 system review of systems is positive for  aching muscles, itching and all other systems negative  PMH:  Past Medical History:  Diagnosis Date  . Asthma    w seasonal rhinitis  . Coronary artery calcification seen on CAT scan    nomal nuclear stress test  . Hx of cardiovascular stress test    coronary artery calcifications with normal nuclear study in 2010  . Paroxysmal atrial flutter ([place])   . Presence of permanent cardiac pacemaker   . Sigmoid diverticulosis   . Stroke [facility])    TIA "a year and a half ago"  . Tubulovillous adenoma polyp of colon     Social History:  Social History   Social History  . Marital status: Divorced    Spouse name: N/A  . Number of children: N/A  . Years of education: N/A   Occupational History  . Not on file.   Social History Main Topics  . Smoking status: Never Smoker  . Smokeless tobacco: Never Used  . Alcohol use 0.6 oz/week    1 Glasses of wine per week     Comment: once daily wine  . Drug use: No  . Sexual activity: Yes    Birth control/ protection: Condom   Other Topics Concern  . Not on file   Social History Narrative  . No narrative on file    Medications:   Current Outpatient Prescriptions on File Prior to Visit  Medication Sig Dispense Refill  . apixaban (ELIQUIS) 5 MG TABS tablet Take 1 tablet (5 mg total) by mouth 2 (two) times daily. Start on Monday-07/17/15 120 tablet 0  . atorvastatin (LIPITOR) 10 MG tablet Take 10 mg by mouth every evening.  2  . Coenzyme Q10 (COQ10 PO) Take 400 mg by mouth daily.     [name] lisinopril (PRINIVIL,ZESTRIL) 20 MG tablet Take 1 tablet (20 mg total) by mouth daily. 90 tablet 0  . loratadine (CLARITIN) 10 MG tablet Take 10 mg by mouth daily as needed for allergies.    . Multiple Vitamin (MULTIVITAMIN WITH MINERALS) TABS tablet Take 1 tablet by mouth daily.    . polyethylene glycol-electrolytes (NULYTELY/GOLYTELY) 420 g solution Take 4,000 mLs by mouth once.  0   No current  facility-administered medications on file prior to visit.     Allergies:  No Known Allergies  Physical Exam General: well developed, well nourished elderly caucasian male, seated, in no evident distress Head: head normocephalic and atraumatic.  Neck: supple with no carotid or supraclavicular bruits Cardiovascular: regular rate and rhythm, no murmurs Musculoskeletal: no deformity Skin:  no rash/petichiae Vascular:  Normal pulses all extremities Vitals:   03/12/17 1600  BP: 133/87  Pulse: 64   Neurologic Exam Mental Status: Awake and fully alert. Oriented to place and time. Recent and remote memory intact. Attention span, concentration and fund of knowledge appropriate. Mood and affect appropriate.  Cranial Nerves: Fundoscopic exam not done. Pupils equal, briskly reactive to light. Extraocular movements full without nystagmus. Visual fields full to confrontation. Hearing intact. Facial sensation intact. Face,  tongue, palate moves normally and symmetrically.  Motor: Normal bulk and tone. Normal strength in all tested extremity muscles. Sensory.: intact to touch ,pinprick .position and vibratory sensation.  Coordination: Rapid alternating movements normal in all extremities. Finger-to-nose and heel-to-shin performed accurately bilaterally. Gait and Station: Arises from chair without difficulty. Stance is normal. Gait demonstrates normal stride length and balance . Able to heel, toe and tandem walk without difficulty.  Reflexes: 1+ and symmetric. Toes downgoing.       ASSESSMENT: 34 year Caucasian male with left hemispheric TIA of cardiac embolic etiology due to atrial fibrillation/flutter in October 2016. Vascular risk factors of atrial fibrillation/flutter, hypertension, hyperlipidemia, coronary artery disease    PLAN: I had a long d/w patient about his remote stroke/TIA,atrial fibrillation risk for recurrent stroke/TIAs, personally independently reviewed imaging studies and stroke  evaluation results and answered questions.Continue Eliquis (apixaban) daily  for secondary stroke prevention and maintain strict control of hypertension with blood pressure goal below 130/90, diabetes with hemoglobin A1c goal below 6.5% and lipids with LDL cholesterol goal below 70 mg/dL. I also advised the patient to eat a healthy diet with plenty of whole grains, cereals, fruits and vegetables, exercise regularly and maintain ideal body weight. Check screening carotid ultrasound study  .Greater than 50% time during this 25 minute visit was spent on counseling and coordination of care of his atrial fibrillation and stroke risk Followup in the future with me in one year or call earlier if necessary.  Antony Contras, MD  Note: This document was prepared with digital dictation and possible smart phrase technology. Any transcriptional errors that result from this process are unintentional

## 2017-03-12 NOTE — Patient Instructions (Signed)
I had a long d/w patient about his remote stroke/TIA,atrial fibrillation risk for recurrent stroke/TIAs, personally independently reviewed imaging studies and stroke evaluation results and answered questions.Continue Eliquis (apixaban) daily  for secondary stroke prevention and maintain strict control of hypertension with blood pressure goal below 130/90, diabetes with hemoglobin A1c goal below 6.5% and lipids with LDL cholesterol goal below 70 mg/dL. I also advised the patient to eat a healthy diet with plenty of whole grains, cereals, fruits and vegetables, exercise regularly and maintain ideal body weight. Check screening carotid ultrasound study Followup in the future with me in one year or call earlier if necessary

## 2017-04-02 ENCOUNTER — Ambulatory Visit (HOSPITAL_COMMUNITY)
Admission: RE | Admit: 2017-04-02 | Discharge: 2017-04-02 | Disposition: A | Discharge status: Home / Self Care | Payer: Medicare Other | Source: Ambulatory Visit | Attending: Neurology | Admitting: Neurology

## 2017-04-02 DIAGNOSIS — I6990 Unspecified sequelae of unspecified cerebrovascular disease: Secondary | ICD-10-CM | POA: Diagnosis not present

## 2017-04-02 NOTE — Progress Notes (Signed)
**  Preliminary report by tech**  Carotid artery duplex complete. Findings are consistent with a 1-39 percent stenosis involving the right internal carotid artery and the left internal carotid artery. The vertebral arteries demonstrate antegrade flow.  04/02/17 4:23 PM Carlos Levering RVT

## 2017-04-03 LAB — VAS US CAROTID
LCCAPDIAS: 22 cm/s
LCCAPSYS: 118 cm/s
LEFT ECA DIAS: -7 cm/s
LEFT VERTEBRAL DIAS: -11 cm/s
LICADDIAS: -13 cm/s
LICAPSYS: -49 cm/s
Left CCA dist dias: -15 cm/s
Left CCA dist sys: -89 cm/s
Left ICA dist sys: -51 cm/s
Left ICA prox dias: -13 cm/s
RCCADSYS: -52 cm/s
RCCAPDIAS: 21 cm/s
RIGHT ECA DIAS: -16 cm/s
RIGHT VERTEBRAL DIAS: -9 cm/s
Right CCA prox sys: 87 cm/s

## 2017-04-07 ENCOUNTER — Telehealth: Payer: Self-pay

## 2017-04-07 NOTE — Telephone Encounter (Signed)
LEft vm for patient to call back during business hours for carotid doppler results.

## 2017-04-07 NOTE — Telephone Encounter (Signed)
-----   Message from Garvin Fila, MD sent at 04/07/2017  8:54 AM EDT ----- Mitchell Heir inform the patient had carotid Doppler study shows no significant narrowing

## 2017-04-08 NOTE — Telephone Encounter (Signed)
Rn call patient that the carotid doppler shows not significant narrowing. Pt verbalized understanding.

## 2017-04-08 NOTE — Telephone Encounter (Signed)
Patient returning call regarding doppler results.

## 2017-05-15 ENCOUNTER — Encounter: Payer: Self-pay | Admitting: Cardiology

## 2017-05-15 ENCOUNTER — Ambulatory Visit (INDEPENDENT_AMBULATORY_CARE_PROVIDER_SITE_OTHER): Payer: Medicare Other | Admitting: Cardiology

## 2017-05-15 VITALS — BP 148/90 | HR 73 | Ht 77.0 in | Wt 232.2 lb

## 2017-05-15 DIAGNOSIS — I442 Atrioventricular block, complete: Secondary | ICD-10-CM | POA: Diagnosis not present

## 2017-05-15 DIAGNOSIS — I481 Persistent atrial fibrillation: Secondary | ICD-10-CM

## 2017-05-15 DIAGNOSIS — I4819 Other persistent atrial fibrillation: Secondary | ICD-10-CM

## 2017-05-15 LAB — CUP PACEART INCLINIC DEVICE CHECK
Battery Remaining Longevity: 75 mo
Brady Statistic AP VS Percent: 0.01 %
Brady Statistic AS VS Percent: 0.34 %
Brady Statistic RA Percent Paced: 9.64 %
Implantable Lead Implant Date: 20161007
Implantable Lead Model: 5076
Implantable Lead Model: 5076
Lead Channel Impedance Value: 323 Ohm
Lead Channel Impedance Value: 437 Ohm
Lead Channel Pacing Threshold Amplitude: 0.5 V
Lead Channel Pacing Threshold Pulse Width: 0.4 ms
Lead Channel Sensing Intrinsic Amplitude: 0.25 mV
Lead Channel Sensing Intrinsic Amplitude: 0.25 mV
Lead Channel Sensing Intrinsic Amplitude: 9.375 mV
Lead Channel Setting Pacing Amplitude: 2 V
Lead Channel Setting Pacing Pulse Width: 0.4 ms
MDC IDC LEAD IMPLANT DT: 20161007
MDC IDC LEAD LOCATION: 753859
MDC IDC LEAD LOCATION: 753860
MDC IDC MSMT BATTERY VOLTAGE: 3 V
MDC IDC MSMT LEADCHNL RV IMPEDANCE VALUE: 494 Ohm
MDC IDC MSMT LEADCHNL RV IMPEDANCE VALUE: 608 Ohm
MDC IDC MSMT LEADCHNL RV SENSING INTR AMPL: 9.375 mV
MDC IDC PG IMPLANT DT: 20161007
MDC IDC SESS DTM: 20180809160932
MDC IDC SET LEADCHNL RV PACING AMPLITUDE: 2 V
MDC IDC SET LEADCHNL RV SENSING SENSITIVITY: 4 mV
MDC IDC STAT BRADY AP VP PERCENT: 10.07 %
MDC IDC STAT BRADY AS VP PERCENT: 89.58 %
MDC IDC STAT BRADY RV PERCENT PACED: 99.61 %

## 2017-05-15 NOTE — Patient Instructions (Signed)
Medication Instructions:   NO CHANGE  Labwork:  Your physician recommends that you HAVE LAB WORK TODAY  Follow-Up:  Your physician wants you to follow-up in: Catharine will receive a reminder letter in the mail two months in advance. If you don't receive a letter, please call our office to schedule the follow-up appointment.   Remote monitoring is used to monitor your Pacemaker of ICD from home. This monitoring reduces the number of office visits required to check your device to one time per year. It allows [place] to keep an eye on the functioning of your device to ensure it is working properly. You are scheduled for a device check from home on 08-14-17. You may send your transmission at any time that day. If you have a wireless device, the transmission will be sent automatically. After your physician reviews your transmission, you will receive a postcard with your next transmission date.   If you need a refill on your cardiac medications before your next appointment, please call your pharmacy.

## 2017-05-15 NOTE — Progress Notes (Signed)
Electrophysiology Office Note   Date:  05/15/2017   ID:  Duane Mercado, DOB [birth year], MRN [id number]  PCP:  Duane Fair, MD  Cardiologist:  Duane Mercado Primary Electrophysiologist:  Duane Mercado Duane Leeds, MD    Chief Complaint  Patient presents with  . Pacemaker Check    Complete heart block     History of Present Illness: Duane Mercado is a 76 y.o. male who presents today for electrophysiology evaluation.  He has a history of coronary calcification on CT with a negative Myoview, sleep apnea not on CPAP, atrial flutter status post multiple cardioversions in 2003 and 2007. On the night of 07/11/15 he had significant fatigue and felt very imbalanced. He noticed slightly worsening right-sided m weakness when he walked. His speech was also slurred according to his family. He was brought to [facility] for further evaluation. He was noted to have atrial fibrillation of unknown duration and a slow ventricular response in the 30s. At that hospitalization he had a dual chamber pacemaker placed. Neurology at that time could not rule out MRI negative cerebral ischemia. He was placed on Eliquis at the time due to his TIA.   Today, denies symptoms of palpitations, chest pain, shortness of breath, orthopnea, PND, lower extremity edema, claudication, dizziness, presyncope, syncope, bleeding, or neurologic sequela. The patient is tolerating medications without difficulties and is otherwise without complaint today.    Past Medical History:  Diagnosis Date  . Asthma    w seasonal rhinitis  . Coronary artery calcification seen on CAT scan    nomal nuclear stress test  . Hx of cardiovascular stress test    coronary artery calcifications with normal nuclear study in 2010  . Paroxysmal atrial flutter ([place])   . Presence of permanent cardiac pacemaker   . Sigmoid diverticulosis   . Stroke [facility])    TIA "a year and a half ago"  . Tubulovillous adenoma polyp of colon    Past Surgical History:    Procedure Laterality Date  . COLONOSCOPY WITH PROPOFOL N/A 10/01/2016   Procedure: COLONOSCOPY WITH PROPOFOL;  Surgeon: Duane Fair, MD;  Location: [facility] ENDOSCOPY;  Service: Endoscopy;  Laterality: N/A;  . EP IMPLANTABLE DEVICE N/A 07/13/2015   Procedure: Pacemaker Implant;  Surgeon: Duane Mercado Duane Leeds, MD;  Location: [facility] CV LAB;  Service: Cardiovascular;  Laterality: N/A;  . INGUINAL HERNIA REPAIR    . tubulovillous adenomatous  12/2008   polyps removed colonoscopically     Current Outpatient Prescriptions  Medication Sig Dispense Refill  . apixaban (ELIQUIS) 5 MG TABS tablet Take 1 tablet (5 mg total) by mouth 2 (two) times daily. Start on Monday-07/17/15 120 tablet 0  . atorvastatin (LIPITOR) 10 MG tablet Take 10 mg by mouth every evening.  2  . Coenzyme Q10 (COQ10 PO) Take 400 mg by mouth daily.     [name] lisinopril (PRINIVIL,ZESTRIL) 20 MG tablet Take 1 tablet (20 mg total) by mouth daily. 90 tablet 0  . loratadine (CLARITIN) 10 MG tablet Take 10 mg by mouth daily as needed for allergies.    . Multiple Vitamin (MULTIVITAMIN WITH MINERALS) TABS tablet Take 1 tablet by mouth daily.     No current facility-administered medications for this visit.     Allergies:   Patient has no known allergies.   Social History:  The patient  reports that he has never smoked. He has never used smokeless tobacco. He reports that he drinks about 0.6 oz of alcohol per week . He  reports that he does not use drugs.   Family History:  The patient's family history includes Heart disease in his father; Hypertension in his father and mother; Melanoma in his father.    ROS:  Please see the history of present illness.   Otherwise, review of systems is positive for Muscle pain.   All other systems are reviewed and negative.   PHYSICAL EXAM: VS:  BP (!) 148/90   Pulse 73   Ht 6\' 5"  (1.956 m)   Wt 232 lb 3.2 oz (105.3 kg)   BMI 27.53 kg/m  , BMI Body mass index is 27.53 kg/m. GEN: Well nourished,  well developed, in no acute distress  HEENT: normal  Neck: no JVD, carotid bruits, or masses Cardiac: RRR; no murmurs, rubs, or gallops,no edema  Respiratory:  clear to auscultation bilaterally, normal work of breathing GI: soft, nontender, nondistended, + BS MS: no deformity or atrophy  Skin: warm and dry, device site well healed Neuro:  Strength and sensation are intact Psych: euthymic mood, full affect  EKG:  EKG is ordered today. Personal review of the ekg ordered shows atrial flutter, ventricular paced  Personal review of the device interrogation today. Results in [facility]: No results found for requested labs within last 8760 hours.    Lipid Panel     Component Value Date/Time   CHOL 167 07/13/2015 0704   TRIG 64 07/13/2015 0704   HDL 46 07/13/2015 0704   CHOLHDL 3.6 07/13/2015 0704   VLDL 13 07/13/2015 0704   LDLCALC 108 (H) 07/13/2015 0704     Wt Readings from Last 3 Encounters:  05/15/17 232 lb 3.2 oz (105.3 kg)  03/12/17 230 lb 6.4 oz (104.5 kg)  10/01/16 224 lb (101.6 kg)      Other studies Reviewed: Additional studies/ records that were reviewed today include: TTE 07/13/15 Review of the above records today demonstrates:  - Left ventricle: The cavity size was normal. Wall thickness was increased in a pattern of moderate LVH. Systolic function was normal. The estimated ejection fraction was in the range of 50% to 55%. Wall motion was normal; there were no regional wall motion abnormalities. - Aortic valve: There was mild regurgitation. - Left atrium: The atrium was moderately dilated. - Right atrium: The atrium was moderately dilated.   ASSESSMENT AND PLAN:  1.  Complete heart block: Status post Medtronic dual chamber pacemaker on 07/13/15. Device functioning appropriately. No changes at this time.  2. Persistent atrial fibrillation: Currently on Eliquis. He is in atrial fibrillation today and has been for the last year. He is  feeling well. It is unlikely that he Duane Mercado require a rhythm control. We'll continue with a rate control strategy.  This patients CHA2DS2-VASc Score and unadjusted Ischemic Stroke Rate (% per year) is equal to 4.8 % stroke rate/year from a score of 4  Above score calculated as 1 point each if present [CHF, HTN, DM, Vascular=MI/PAD/Aortic Plaque, Age if 65-74, or Male] Above score calculated as 2 points each if present [Age > 75, or Stroke/TIA/TE]   Current medicines are reviewed at length with the patient today.   The patient does not have concerns regarding his medicines.  The following changes were made today:  none  Labs/ tests ordered today include:  No orders of the defined types were placed in this encounter.    Disposition:   FU with Zaul Hubers 1 year  Signed, Karrine Kluttz Duane Leeds, MD is 05/15/2017 3:34 PM  [address] [place] [address] [phone] (office) [phone] (fax)

## 2017-05-15 NOTE — Addendum Note (Signed)
Addended by: Ernst Breach, Adonis Huguenin on: 05/15/2017 04:00 PM   Modules accepted: Orders

## 2017-05-16 LAB — CBC
HEMOGLOBIN: 13.6 g/dL (ref 13.0–17.7)
Hematocrit: 41.7 % (ref 37.5–51.0)
MCH: 30.8 pg (ref 26.6–33.0)
MCHC: 32.6 g/dL (ref 31.5–35.7)
MCV: 95 fL (ref 79–97)
PLATELETS: 174 10*3/uL (ref 150–379)
RBC: 4.41 x10E6/uL (ref 4.14–5.80)
RDW: 13.4 % (ref 12.3–15.4)
WBC: 7.3 10*3/uL (ref 3.4–10.8)

## 2017-05-16 LAB — BASIC METABOLIC PANEL
BUN / CREAT RATIO: 15 (ref 10–24)
BUN: 15 mg/dL (ref 8–27)
CALCIUM: 9.3 mg/dL (ref 8.6–10.2)
CHLORIDE: 107 mmol/L — AB (ref 96–106)
CO2: 22 mmol/L (ref 20–29)
Creatinine, Ser: 0.99 mg/dL (ref 0.76–1.27)
GFR, EST AFRICAN AMERICAN: 85 mL/min/{1.73_m2} (ref >59)
GFR, EST NON AFRICAN AMERICAN: 73 mL/min/{1.73_m2} (ref >59)
Glucose: 90 mg/dL (ref 65–99)
Potassium: 4.6 mmol/L (ref 3.5–5.2)
Sodium: 144 mmol/L (ref 134–144)

## 2017-07-23 DIAGNOSIS — G459 Transient cerebral ischemic attack, unspecified: Secondary | ICD-10-CM | POA: Diagnosis not present

## 2017-07-23 DIAGNOSIS — I482 Chronic atrial fibrillation: Secondary | ICD-10-CM | POA: Diagnosis not present

## 2017-07-23 DIAGNOSIS — I10 Essential (primary) hypertension: Secondary | ICD-10-CM | POA: Diagnosis not present

## 2017-07-23 DIAGNOSIS — E7800 Pure hypercholesterolemia, unspecified: Secondary | ICD-10-CM | POA: Diagnosis not present

## 2017-07-23 DIAGNOSIS — R7303 Prediabetes: Secondary | ICD-10-CM | POA: Diagnosis not present

## 2017-07-23 DIAGNOSIS — Z950 Presence of cardiac pacemaker: Secondary | ICD-10-CM | POA: Diagnosis not present

## 2017-08-14 ENCOUNTER — Ambulatory Visit (INDEPENDENT_AMBULATORY_CARE_PROVIDER_SITE_OTHER): Payer: Medicare Other | Admitting: *Deleted

## 2017-08-14 ENCOUNTER — Telehealth: Payer: Self-pay | Admitting: Cardiology

## 2017-08-14 DIAGNOSIS — I442 Atrioventricular block, complete: Secondary | ICD-10-CM

## 2017-08-14 NOTE — Telephone Encounter (Signed)
Spoke with pt and reminded pt of remote transmission that is due today. Pt verbalized understanding.   

## 2017-08-15 ENCOUNTER — Encounter: Payer: Self-pay | Admitting: Cardiology

## 2017-08-15 NOTE — Progress Notes (Signed)
Remote pacemaker transmission.   

## 2017-08-19 LAB — CUP PACEART REMOTE DEVICE CHECK
Battery Remaining Longevity: 71 mo
Battery Voltage: 3 V
Brady Statistic RA Percent Paced: 5.44 %
Brady Statistic RV Percent Paced: 99.45 %
Date Time Interrogation Session: 20181108224847
Implantable Lead Implant Date: 20161007
Implantable Lead Location: 753859
Implantable Lead Model: 5076
Implantable Lead Model: 5076
Implantable Pulse Generator Implant Date: 20161007
Lead Channel Impedance Value: 456 Ohm
Lead Channel Impedance Value: 570 Ohm
Lead Channel Pacing Threshold Pulse Width: 0.4 ms
Lead Channel Sensing Intrinsic Amplitude: 0.125 mV
Lead Channel Sensing Intrinsic Amplitude: 0.125 mV
Lead Channel Sensing Intrinsic Amplitude: 13.375 mV
Lead Channel Setting Pacing Amplitude: 2 V
Lead Channel Setting Sensing Sensitivity: 4 mV
MDC IDC LEAD IMPLANT DT: 20161007
MDC IDC LEAD LOCATION: 753860
MDC IDC MSMT LEADCHNL RA IMPEDANCE VALUE: 342 Ohm
MDC IDC MSMT LEADCHNL RV IMPEDANCE VALUE: 475 Ohm
MDC IDC MSMT LEADCHNL RV PACING THRESHOLD AMPLITUDE: 0.625 V
MDC IDC MSMT LEADCHNL RV SENSING INTR AMPL: 13.375 mV
MDC IDC SET LEADCHNL RA PACING AMPLITUDE: 2 V
MDC IDC SET LEADCHNL RV PACING PULSEWIDTH: 0.4 ms
MDC IDC STAT BRADY AP VP PERCENT: 5.77 %
MDC IDC STAT BRADY AP VS PERCENT: 0.01 %
MDC IDC STAT BRADY AS VP PERCENT: 93.72 %
MDC IDC STAT BRADY AS VS PERCENT: 0.51 %

## 2017-08-20 DIAGNOSIS — L814 Other melanin hyperpigmentation: Secondary | ICD-10-CM | POA: Diagnosis not present

## 2017-08-20 DIAGNOSIS — D1801 Hemangioma of skin and subcutaneous tissue: Secondary | ICD-10-CM | POA: Diagnosis not present

## 2017-08-20 DIAGNOSIS — L821 Other seborrheic keratosis: Secondary | ICD-10-CM | POA: Diagnosis not present

## 2017-08-20 DIAGNOSIS — M7981 Nontraumatic hematoma of soft tissue: Secondary | ICD-10-CM | POA: Diagnosis not present

## 2017-08-20 DIAGNOSIS — D229 Melanocytic nevi, unspecified: Secondary | ICD-10-CM | POA: Diagnosis not present

## 2017-11-13 ENCOUNTER — Ambulatory Visit (INDEPENDENT_AMBULATORY_CARE_PROVIDER_SITE_OTHER): Payer: Medicare Other | Admitting: *Deleted

## 2017-11-13 DIAGNOSIS — I442 Atrioventricular block, complete: Secondary | ICD-10-CM | POA: Diagnosis not present

## 2017-11-13 NOTE — Progress Notes (Signed)
Remote pacemaker transmission.   

## 2017-11-19 ENCOUNTER — Encounter: Payer: Self-pay | Admitting: Cardiology

## 2017-12-04 LAB — CUP PACEART REMOTE DEVICE CHECK
Battery Remaining Longevity: 69 mo
Battery Voltage: 3 V
Brady Statistic AP VP Percent: 8.62 %
Brady Statistic RA Percent Paced: 8.18 %
Brady Statistic RV Percent Paced: 99.68 %
Date Time Interrogation Session: 20190207140212
Implantable Lead Implant Date: 20161007
Implantable Lead Model: 5076
Implantable Pulse Generator Implant Date: 20161007
Lead Channel Impedance Value: 437 Ohm
Lead Channel Impedance Value: 494 Ohm
Lead Channel Impedance Value: 551 Ohm
Lead Channel Pacing Threshold Pulse Width: 0.4 ms
Lead Channel Sensing Intrinsic Amplitude: 0.125 mV
Lead Channel Sensing Intrinsic Amplitude: 0.125 mV
Lead Channel Sensing Intrinsic Amplitude: 11.25 mV
Lead Channel Setting Pacing Amplitude: 2 V
Lead Channel Setting Sensing Sensitivity: 4 mV
MDC IDC LEAD IMPLANT DT: 20161007
MDC IDC LEAD LOCATION: 753859
MDC IDC LEAD LOCATION: 753860
MDC IDC MSMT LEADCHNL RA IMPEDANCE VALUE: 323 Ohm
MDC IDC MSMT LEADCHNL RV PACING THRESHOLD AMPLITUDE: 0.5 V
MDC IDC MSMT LEADCHNL RV SENSING INTR AMPL: 11.25 mV
MDC IDC SET LEADCHNL RA PACING AMPLITUDE: 2 V
MDC IDC SET LEADCHNL RV PACING PULSEWIDTH: 0.4 ms
MDC IDC STAT BRADY AP VS PERCENT: 0.01 %
MDC IDC STAT BRADY AS VP PERCENT: 91.09 %
MDC IDC STAT BRADY AS VS PERCENT: 0.28 %

## 2018-02-03 DIAGNOSIS — R350 Frequency of micturition: Secondary | ICD-10-CM | POA: Diagnosis not present

## 2018-02-03 DIAGNOSIS — Z1389 Encounter for screening for other disorder: Secondary | ICD-10-CM | POA: Diagnosis not present

## 2018-02-03 DIAGNOSIS — Z950 Presence of cardiac pacemaker: Secondary | ICD-10-CM | POA: Diagnosis not present

## 2018-02-03 DIAGNOSIS — I10 Essential (primary) hypertension: Secondary | ICD-10-CM | POA: Diagnosis not present

## 2018-02-03 DIAGNOSIS — N529 Male erectile dysfunction, unspecified: Secondary | ICD-10-CM | POA: Diagnosis not present

## 2018-02-03 DIAGNOSIS — I482 Chronic atrial fibrillation: Secondary | ICD-10-CM | POA: Diagnosis not present

## 2018-02-03 DIAGNOSIS — E7800 Pure hypercholesterolemia, unspecified: Secondary | ICD-10-CM | POA: Diagnosis not present

## 2018-02-03 DIAGNOSIS — Z125 Encounter for screening for malignant neoplasm of prostate: Secondary | ICD-10-CM | POA: Diagnosis not present

## 2018-02-12 ENCOUNTER — Telehealth: Payer: Self-pay | Admitting: Cardiology

## 2018-02-12 ENCOUNTER — Ambulatory Visit (INDEPENDENT_AMBULATORY_CARE_PROVIDER_SITE_OTHER): Payer: Medicare Other | Admitting: *Deleted

## 2018-02-12 DIAGNOSIS — I442 Atrioventricular block, complete: Secondary | ICD-10-CM | POA: Diagnosis not present

## 2018-02-12 DIAGNOSIS — H5203 Hypermetropia, bilateral: Secondary | ICD-10-CM | POA: Diagnosis not present

## 2018-02-12 DIAGNOSIS — H2513 Age-related nuclear cataract, bilateral: Secondary | ICD-10-CM | POA: Diagnosis not present

## 2018-02-12 NOTE — Telephone Encounter (Signed)
LMOVM reminding pt to send remote transmission.   

## 2018-02-13 DIAGNOSIS — R3915 Urgency of urination: Secondary | ICD-10-CM | POA: Diagnosis not present

## 2018-02-13 DIAGNOSIS — R972 Elevated prostate specific antigen [PSA]: Secondary | ICD-10-CM | POA: Diagnosis not present

## 2018-02-13 DIAGNOSIS — N401 Enlarged prostate with lower urinary tract symptoms: Secondary | ICD-10-CM | POA: Diagnosis not present

## 2018-02-13 DIAGNOSIS — N5201 Erectile dysfunction due to arterial insufficiency: Secondary | ICD-10-CM | POA: Diagnosis not present

## 2018-02-16 NOTE — Progress Notes (Signed)
Remote pacemaker transmission.   

## 2018-02-17 ENCOUNTER — Encounter: Payer: Self-pay | Admitting: Cardiology

## 2018-03-10 LAB — CUP PACEART REMOTE DEVICE CHECK
Battery Remaining Longevity: 62 mo
Brady Statistic AP VS Percent: 0.01 %
Brady Statistic AS VS Percent: 0.24 %
Implantable Lead Implant Date: 20161007
Implantable Lead Model: 5076
Implantable Pulse Generator Implant Date: 20161007
Lead Channel Impedance Value: 304 Ohm
Lead Channel Impedance Value: 418 Ohm
Lead Channel Pacing Threshold Amplitude: 0.5 V
Lead Channel Pacing Threshold Pulse Width: 0.4 ms
Lead Channel Sensing Intrinsic Amplitude: 12.75 mV
Lead Channel Setting Pacing Amplitude: 2 V
Lead Channel Setting Pacing Pulse Width: 0.4 ms
Lead Channel Setting Sensing Sensitivity: 4 mV
MDC IDC LEAD IMPLANT DT: 20161007
MDC IDC LEAD LOCATION: 753859
MDC IDC LEAD LOCATION: 753860
MDC IDC MSMT BATTERY VOLTAGE: 3 V
MDC IDC MSMT LEADCHNL RA SENSING INTR AMPL: 0.125 mV
MDC IDC MSMT LEADCHNL RA SENSING INTR AMPL: 0.125 mV
MDC IDC MSMT LEADCHNL RV IMPEDANCE VALUE: 494 Ohm
MDC IDC MSMT LEADCHNL RV IMPEDANCE VALUE: 551 Ohm
MDC IDC MSMT LEADCHNL RV SENSING INTR AMPL: 12.75 mV
MDC IDC SESS DTM: 20190509211012
MDC IDC SET LEADCHNL RV PACING AMPLITUDE: 2 V
MDC IDC STAT BRADY AP VP PERCENT: 13.79 %
MDC IDC STAT BRADY AS VP PERCENT: 85.96 %
MDC IDC STAT BRADY RA PERCENT PACED: 12.98 %
MDC IDC STAT BRADY RV PERCENT PACED: 99.71 %

## 2018-03-25 ENCOUNTER — Ambulatory Visit (INDEPENDENT_AMBULATORY_CARE_PROVIDER_SITE_OTHER): Payer: Medicare Other | Admitting: Adult Health

## 2018-03-25 ENCOUNTER — Encounter: Payer: Self-pay | Admitting: Adult Health

## 2018-03-25 VITALS — BP 120/72 | HR 60 | Ht 77.0 in | Wt 226.0 lb

## 2018-03-25 DIAGNOSIS — I4819 Other persistent atrial fibrillation: Secondary | ICD-10-CM

## 2018-03-25 DIAGNOSIS — I481 Persistent atrial fibrillation: Secondary | ICD-10-CM | POA: Diagnosis not present

## 2018-03-25 DIAGNOSIS — E7800 Pure hypercholesterolemia, unspecified: Secondary | ICD-10-CM | POA: Diagnosis not present

## 2018-03-25 DIAGNOSIS — G459 Transient cerebral ischemic attack, unspecified: Secondary | ICD-10-CM | POA: Diagnosis not present

## 2018-03-25 NOTE — Progress Notes (Signed)
[facility] [address] [place]. [place] [address] [phone]       OFFICE FOLLOW-UP NOTE  [name] Date of Birth:  [birth year] Medical Record Number:  [id number]   HPI: 56 year Caucasian male seen today for the first office follow-up visit following hospital admission for TIA in October 2016.He had been doing well until last night 07/11/2015 around 8 PM (LKW) when he noted he was having some difficulty walking (not able to pick up either foot properly and tended to shuffle his feet). He went to sleep thinking he was tired. The next day he noted he was clumsy with his right hand (he is left handed) and noted some subjective decreased sensation in his right hand. Furthermore, his niece said that she thinks patient is having slurred speech since last night. Denies HA, vertigo, double vision, trouble swallowing, or vision impairment. Modified Rankin: Rankin Score=0. Patient was not administered TPA secondary to being out of the window. He was admitted for further evaluation and treatment. CT scan of the head on admission showed only mild chronic microvascular ischemic changes. MRI scan of the brain showed no acute infarct and and confirmed mild small vessel changes. MRA of the brain showed a focal severe stenosis of proximal right M2 branch and moderate multifocal atheromatous changes in the posterior cerebral arteries. Carotid Doppler showed no significant extracranial stenosis. Route extremity venous Doppler was negative for DVT. LDL cholesterol was elevated at 108. Hemoglobin A1c was 5.9. Patient was started on eliquis for his atrial fibrillation. In the past he had stop warfarin because of side effects and had been taking only aspirin despite a CHAD2VASc. score of 5. Patient was also found to have significant heart block and bradycardia and cardiology put in a pacemaker. 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 He states his returned back to work 2 weeks ago  and has had no problems. He still feels tired easily. He is a little bit disappointed that the pacemaker has not improved his overall stamina and thinking. He is tolerating eliquis well with only occasional bruising but no bleeding episodes. He is now gotten used to his pacemaker though he had a tough time in the first month .He is also tolerating Lipitor with only a few muscle aches which are not bothersome. 2 weeks ago he had severe abdominal pain and colic likely related to eating too much dry throat and chips. He is feeling much better now Update 03/13/2016.:He returns for follow-up after last visit 6 months ago. He continues to do well without recurrent stroke or TIA symptoms. He remains on eliquis which he is tolerating well without bleeding and only minor bruising. He states her blood pressure is well controlled at home and usually in the low 818E systolic range but today it is quite low in office at 107/72. She had and he had his lipid profile checked in May by Dr. Wynetta Emery family physician who reduced the dose of Lipitor from 20 to 10 mg as patient is complaining of myalgias. This appears to have improved and is also taking coenzyme q 10. He has no new complaints today. His remains quite active. Update 03/13/2017: He returns for follow-up after last visit a year ago. Continues to well without recurrent stroke or TIA symptoms. Remains on eliquis which is tolerating well with only minor bruising but no major bleeding episodes. His blood pressure remains well controlled and today it is 133/87. He states his last lipid profile checked in  the fall of 2017 was quite low and hence primary care physician reduced the dose of Lipitor to 10 mg daily. He still having some muscle aches and pains but does take CoQ10 and is not sure if it helps. He did have a colonoscopy last December was fine. He has no new neurological complaints. He has not had a follow-up carotid ultrasound done since this stroke couple of years  ago.  03/25/18 UPDATE: Patient returns today for one-year follow-up and overall is doing well from neurology standpoint.  He continues to take his Eliquis without bleeding or bruising.  Approximately 2 days ago though he did wake up with right inner eye redness but will be seeing ophthalmologist tomorrow to assess this.  He continues to take Lipitor with mild myalgias but also continues to take co-Q10 200 mg which does help pain slightly.  Blood pressure satisfactory 120/72.  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  He continues to follow-up with cardiology for atrial fibrillation and Eliquis management.  Continues to follow-up with urology as well.  Patient did have repeat carotid ultrasound on 04/03/2017 which did show bilateral ICA stenosis of 1 to 39% and VA antegrade flow.  Denies new or worsening stroke/TIA symptoms.   ROS:   14 system review of systems is positive for slight eye itching, eye redness, frequency of urination and urgency and all other systems negative  PMH:  Past Medical History:  Diagnosis Date  . Asthma    w seasonal rhinitis  . Coronary artery calcification seen on CAT scan    nomal nuclear stress test  . Hx of cardiovascular stress test    coronary artery calcifications with normal nuclear study in 2010  . Paroxysmal atrial flutter ([place])   . Presence of permanent cardiac pacemaker   . Sigmoid diverticulosis   . Stroke [facility])    TIA "a year and a half ago"  . Tubulovillous adenoma polyp of colon     Social History:  Social History   Socioeconomic History  . Marital status: Divorced    Spouse name: Not on file  . Number of children: Not on file  . Years of education: Not on file  . Highest education level: Not on file  Occupational History  . Not on file  Social Needs  . Financial resource strain: Not on file  . Food insecurity:    Worry: Not on file    Inability: Not on file  . Transportation needs:    Medical: Not on file     Non-medical: Not on file  Tobacco Use  . Smoking status: Never Smoker  . Smokeless tobacco: Never Used  Substance and Sexual Activity  . Alcohol use: Yes    Alcohol/week: 0.6 oz    Types: 1 Glasses of wine per week    Comment: once daily wine  . Drug use: No  . Sexual activity: Yes    Birth control/protection: Condom  Lifestyle  . Physical activity:    Days per week: Not on file    Minutes per session: Not on file  . Stress: Not on file  Relationships  . Social connections:    Talks on phone: Not on file    Gets together: Not on file    Attends religious service: Not on file    Active member of club or organization: Not on file    Attends meetings of clubs or organizations: Not on file    Relationship status: Not on file  .  Intimate partner violence:    Fear of current or ex partner: Not on file    Emotionally abused: Not on file    Physically abused: Not on file    Forced sexual activity: Not on file  Other Topics Concern  . Not on file  Social History Narrative  . Not on file    Medications:   Current Outpatient Medications on File Prior to Visit  Medication Sig Dispense Refill  . apixaban (ELIQUIS) 5 MG TABS tablet Take 1 tablet (5 mg total) by mouth 2 (two) times daily. Start on Monday-07/17/15 120 tablet 0  . atorvastatin (LIPITOR) 10 MG tablet Take 10 mg by mouth every evening.  2  . Coenzyme Q10 (COQ10 PO) Take 400 mg by mouth daily.     [name] lisinopril (PRINIVIL,ZESTRIL) 20 MG tablet Take 1 tablet (20 mg total) by mouth daily. 90 tablet 0  . loratadine (CLARITIN) 10 MG tablet Take 10 mg by mouth daily as needed for allergies.    . Multiple Vitamin (MULTIVITAMIN WITH MINERALS) TABS tablet Take 1 tablet by mouth daily.    . tadalafil (CIALIS) 5 MG tablet Take 5 mg by mouth daily.     No current facility-administered medications on file prior to visit.     Allergies:  No Known Allergies  Physical Exam General: well developed, well nourished pleasant elderly  caucasian male, seated, in no evident distress Head: head normocephalic and atraumatic.  Neck: supple with no carotid or supraclavicular bruits Cardiovascular: regular rate and rhythm, no murmurs Musculoskeletal: no deformity Skin:  no rash/petichiae Vascular:  Normal pulses all extremities Vitals:   03/25/18 1526  BP: 120/72  Pulse: 60   Neurologic Exam Mental Status: Awake and fully alert. Oriented to place and time. Recent and remote memory intact. Attention span, concentration and fund of knowledge appropriate. Mood and affect appropriate.  Cranial Nerves: Fundoscopic exam not done. Pupils equal, briskly reactive to light. Extraocular movements full without nystagmus. Visual fields full to confrontation.  Observed eye redness in right inner eye.  Hearing intact. Facial sensation intact. Face, tongue, palate moves normally and symmetrically.  Motor: Normal bulk and tone. Normal strength in all tested extremity muscles. Sensory.: intact to touch ,pinprick .position and vibratory sensation.  Coordination: Rapid alternating movements normal in all extremities. Finger-to-nose and heel-to-shin performed accurately bilaterally. Gait and Station: Arises from chair without difficulty. Stance is normal. Gait demonstrates normal stride length and balance . Able to heel, toe and tandem walk without difficulty.  Reflexes: 1+ and symmetric. Toes downgoing.     ASSESSMENT: 7 year Caucasian male with left hemispheric TIA of cardiac embolic etiology due to atrial fibrillation/flutter in October 2016. Vascular risk factors of atrial fibrillation/flutter, hypertension, hyperlipidemia, coronary artery disease.  Patient returns today for follow-up visit and overall is doing well.    PLAN: -Continue Eliquis (apixaban) daily  and lipitor  for secondary stroke prevention -F/u with PCP regarding your HLD, HTN and CAD management -f/u with cardiology regarding atrial fibrillation and eliquis  management -continue to monitor BP at home -Maintain strict control of hypertension with blood pressure goal below 130/90, diabetes with hemoglobin A1c goal below 6.5% and cholesterol with LDL cholesterol (bad cholesterol) goal below 70 mg/dL. I also advised the patient to eat a healthy diet with plenty of whole grains, cereals, fruits and vegetables, exercise regularly and maintain ideal body weight.  Follow up as needed or call earlier if needed  Greater than 50% of time during this 25 minute visit  was spent on counseling,explanation of diagnosis of TIA, reviewing risk factor management of A. fib, HTN, HLD and CAD, planning of further management, discussion with patient and family and coordination of care  Venancio Poisson, [facility]  [facility] [address] [place], [place] [address]  Phone [phone] Fax [phone]

## 2018-03-25 NOTE — Patient Instructions (Signed)
Continue Eliquis (apixaban) daily  and lipitor  for secondary stroke prevention  Continue to follow up with PCP regarding cholesterol and blood pressure management   Continue to follow up with cardiologist regarding atrial fibrillation and eliquis  Continue to follow up with urologist   Follow up with ophthalmologist regarding eye redness  Continue to monitor blood pressure at home  Maintain strict control of hypertension with blood pressure goal below 130/90, diabetes with hemoglobin A1c goal below 6.5% and cholesterol with LDL cholesterol (bad cholesterol) goal below 70 mg/dL. I also advised the patient to eat a healthy diet with plenty of whole grains, cereals, fruits and vegetables, exercise regularly and maintain ideal body weight.  Followup in the future with me as needed or call earlier if needed         Thank you for coming to see [place] at [facility]. I hope we have been able to provide you high quality care today.  You may receive a patient satisfaction survey over the next few weeks. We would appreciate your feedback and comments so that we may continue to improve ourselves and the health of our patients.

## 2018-03-26 DIAGNOSIS — H1131 Conjunctival hemorrhage, right eye: Secondary | ICD-10-CM | POA: Diagnosis not present

## 2018-03-26 NOTE — Progress Notes (Signed)
I agree with the above plan 

## 2018-05-14 ENCOUNTER — Ambulatory Visit (INDEPENDENT_AMBULATORY_CARE_PROVIDER_SITE_OTHER): Payer: Medicare Other | Admitting: *Deleted

## 2018-05-14 DIAGNOSIS — I442 Atrioventricular block, complete: Secondary | ICD-10-CM

## 2018-05-15 NOTE — Progress Notes (Signed)
Remote pacemaker transmission.   

## 2018-05-21 DIAGNOSIS — R972 Elevated prostate specific antigen [PSA]: Secondary | ICD-10-CM | POA: Diagnosis not present

## 2018-06-03 LAB — CUP PACEART REMOTE DEVICE CHECK
Battery Remaining Longevity: 56 mo
Battery Voltage: 3 V
Brady Statistic AP VS Percent: 0.01 %
Brady Statistic AS VS Percent: 0.26 %
Brady Statistic RV Percent Paced: 99.69 %
Implantable Lead Implant Date: 20161007
Implantable Lead Implant Date: 20161007
Implantable Lead Location: 753860
Implantable Lead Model: 5076
Implantable Pulse Generator Implant Date: 20161007
Lead Channel Impedance Value: 304 Ohm
Lead Channel Impedance Value: 418 Ohm
Lead Channel Impedance Value: 494 Ohm
Lead Channel Impedance Value: 551 Ohm
Lead Channel Pacing Threshold Amplitude: 0.625 V
Lead Channel Sensing Intrinsic Amplitude: 0.125 mV
Lead Channel Sensing Intrinsic Amplitude: 11 mV
Lead Channel Setting Pacing Amplitude: 2 V
MDC IDC LEAD LOCATION: 753859
MDC IDC MSMT LEADCHNL RA SENSING INTR AMPL: 0.125 mV
MDC IDC MSMT LEADCHNL RV PACING THRESHOLD PULSEWIDTH: 0.4 ms
MDC IDC MSMT LEADCHNL RV SENSING INTR AMPL: 11 mV
MDC IDC SESS DTM: 20190808150816
MDC IDC SET LEADCHNL RV PACING AMPLITUDE: 2 V
MDC IDC SET LEADCHNL RV PACING PULSEWIDTH: 0.4 ms
MDC IDC SET LEADCHNL RV SENSING SENSITIVITY: 4 mV
MDC IDC STAT BRADY AP VP PERCENT: 11.9 %
MDC IDC STAT BRADY AS VP PERCENT: 87.83 %
MDC IDC STAT BRADY RA PERCENT PACED: 11.23 %

## 2018-07-06 ENCOUNTER — Ambulatory Visit (INDEPENDENT_AMBULATORY_CARE_PROVIDER_SITE_OTHER): Payer: Medicare Other | Admitting: Cardiology

## 2018-07-06 ENCOUNTER — Encounter

## 2018-07-06 ENCOUNTER — Encounter: Payer: Self-pay | Admitting: Cardiology

## 2018-07-06 VITALS — BP 126/80 | HR 82 | Ht 77.0 in | Wt 227.0 lb

## 2018-07-06 DIAGNOSIS — I481 Persistent atrial fibrillation: Secondary | ICD-10-CM | POA: Diagnosis not present

## 2018-07-06 DIAGNOSIS — Z950 Presence of cardiac pacemaker: Secondary | ICD-10-CM | POA: Diagnosis not present

## 2018-07-06 DIAGNOSIS — I442 Atrioventricular block, complete: Secondary | ICD-10-CM | POA: Diagnosis not present

## 2018-07-06 DIAGNOSIS — I4819 Other persistent atrial fibrillation: Secondary | ICD-10-CM

## 2018-07-06 NOTE — Patient Instructions (Signed)
Medication Instructions:  Your physician recommends that you continue on your current medications as directed. Please refer to the Current Medication list given to you today.  *If you need a refill on your cardiac medications before your next appointment, please call your pharmacy*  Labwork: None ordered  Testing/Procedures: None ordered  Follow-Up: Remote monitoring is used to monitor your Pacemaker or ICD from home. This monitoring reduces the number of office visits required to check your device to one time per year. It allows [place] to keep an eye on the functioning of your device to ensure it is working properly. You are scheduled for a device check from home on 08/13/2018. You may send your transmission at any time that day. If you have a wireless device, the transmission will be sent automatically. After your physician reviews your transmission, you will receive a postcard with your next transmission date.  Your physician wants you to follow-up in: 1 year with Dr. Curt Bears.  You will receive a reminder letter in the mail two months in advance. If you don't receive a letter, please call our office to schedule the follow-up appointment.  Thank you for choosing [facility]!!   Trinidad Curet, RN [phone]

## 2018-07-06 NOTE — Progress Notes (Signed)
Electrophysiology Office Note   Date:  07/06/2018   ID:  Duane Mercado, DOB 13-May-1941, MRN [id number]  PCP:  Wenda Low, MD  Cardiologist:  Fransico Him Primary Electrophysiologist:  Neely Cecena Meredith Leeds, MD    No chief complaint on file.    History of Present Illness: Duane Mercado is a 77 y.o. male who presents today for electrophysiology evaluation.  He has a history of coronary calcification on CT with a negative Myoview, sleep apnea not on CPAP, atrial flutter status post multiple cardioversions in 2003 and 2007. On the night of 07/11/15 he had significant fatigue and felt very imbalanced. He noticed slightly worsening right-sided m weakness when he walked. His speech was also slurred according to his family. He was brought to [facility] for further evaluation. He was noted to have atrial fibrillation of unknown duration and a slow ventricular response in the 30s. At that hospitalization he had a dual chamber pacemaker placed. Neurology at that time could not rule out MRI negative cerebral ischemia. He was placed on Eliquis at the time due to his TIA.   Today, denies symptoms of palpitations, chest pain, shortness of breath, orthopnea, PND, lower extremity edema, claudication, dizziness, presyncope, syncope, bleeding, or neurologic sequela. The patient is tolerating medications without difficulties.  He is feeling well.  He has no major   Past Medical History:  Diagnosis Date  . Asthma    w seasonal rhinitis  . Coronary artery calcification seen on CAT scan    nomal nuclear stress test  . Hx of cardiovascular stress test    coronary artery calcifications with normal nuclear study in 2010  . Paroxysmal atrial flutter ([place])   . Presence of permanent cardiac pacemaker   . Sigmoid diverticulosis   . Stroke [facility])    TIA "a year and a half ago"  . Tubulovillous adenoma polyp of colon    Past Surgical History:  Procedure Laterality Date  . COLONOSCOPY WITH PROPOFOL N/A 10/01/2016     Procedure: COLONOSCOPY WITH PROPOFOL;  Surgeon: [name], MD;  Location: [facility] ENDOSCOPY;  Service: Endoscopy;  Laterality: N/A;  . EP IMPLANTABLE DEVICE N/A 07/13/2015   Procedure: Pacemaker Implant;  Surgeon: Lourdes Kucharski Meredith Leeds, MD;  Location: [facility] LAB;  Service: Cardiovascular;  Laterality: N/A;  . INGUINAL HERNIA REPAIR    . tubulovillous adenomatous  12/2008   polyps removed colonoscopically     Current Outpatient Medications  Medication Sig Dispense Refill  . apixaban (ELIQUIS) 5 MG TABS tablet Take 1 tablet (5 mg total) by mouth 2 (two) times daily. Start on Monday-07/17/15 120 tablet 0  . atorvastatin (LIPITOR) 10 MG tablet Take 10 mg by mouth every evening.  2  . Coenzyme Q10 (COQ10 PO) Take 400 mg by mouth daily.     [name] lisinopril (PRINIVIL,ZESTRIL) 20 MG tablet Take 1 tablet (20 mg total) by mouth daily. 90 tablet 0  . loratadine (CLARITIN) 10 MG tablet Take 10 mg by mouth daily as needed for allergies.    . Multiple Vitamin (MULTIVITAMIN WITH MINERALS) TABS tablet Take 1 tablet by mouth daily.    . tadalafil (CIALIS) 5 MG tablet Take 5 mg by mouth daily.     No current facility-administered medications for this visit.     Allergies:   Patient has no known allergies.   Social History:  The patient  reports that he has never smoked. He has never used smokeless tobacco. He reports that he drinks about 1.0 standard drinks  of alcohol per week. He reports that he does not use drugs.   Family History:  The patient's family history includes Heart disease in his father; Hypertension in his father and mother; Melanoma in his father.   ROS:  Please see the history of present illness.   Otherwise, review of systems is positive for slight fatigue, muscle pain, difficulty urinating.   All other systems are reviewed and negative.   PHYSICAL EXAM: VS:  BP 126/80   Pulse 82   Ht 6\' 5"  (1.956 m)   Wt 227 lb (103 kg)   BMI 26.92 kg/m  , BMI Body mass index is 26.92  kg/m. GEN: Well nourished, well developed, in no acute distress  HEENT: normal  Neck: no JVD, carotid bruits, or masses Cardiac: RRR; no murmurs, rubs, or gallops,no edema  Respiratory:  clear to auscultation bilaterally, normal work of breathing GI: soft, nontender, nondistended, + BS MS: no deformity or atrophy  Skin: warm and dry, device site well healed Neuro:  Strength and sensation are intact Psych: euthymic mood, full affect  EKG:  EKG is ordered today. Personal review of the ekg ordered shows atrial flutter, ventricular paced  Personal review of the device interrogation today. Results in [place]: No results found for requested labs within last 8760 hours.    Lipid Panel     Component Value Date/Time   CHOL 167 07/13/2015 0704   TRIG 64 07/13/2015 0704   HDL 46 07/13/2015 0704   CHOLHDL 3.6 07/13/2015 0704   VLDL 13 07/13/2015 0704   LDLCALC 108 (H) 07/13/2015 0704     Wt Readings from Last 3 Encounters:  07/06/18 227 lb (103 kg)  03/25/18 226 lb (102.5 kg)  05/15/17 232 lb 3.2 oz (105.3 kg)      Other studies Reviewed: Additional studies/ records that were reviewed today include: TTE 07/13/15 Review of the above records today demonstrates:  - Left ventricle: The cavity size was normal. Wall thickness was increased in a pattern of moderate LVH. Systolic function was normal. The estimated ejection fraction was in the range of 50% to 55%. Wall motion was normal; there were no regional wall motion abnormalities. - Aortic valve: There was mild regurgitation. - Left atrium: The atrium was moderately dilated. - Right atrium: The atrium was moderately dilated.   ASSESSMENT AND PLAN:  1.  Complete heart block: Is post Medtronic dual-chamber pacemaker implanted 07/13/2015.  Device functioning appropriately.  No changes.    2. Persistent atrial fibrillation: Currently on Eliquis.  Have adopted a rate control strategy.  This patients  CHA2DS2-VASc Score and unadjusted Ischemic Stroke Rate (% per year) is equal to 4.8 % stroke rate/year from a score of 4  Above score calculated as 1 point each if present [CHF, HTN, DM, Vascular=MI/PAD/Aortic Plaque, Age if 65-74, or Male] Above score calculated as 2 points each if present [Age > 75, or Stroke/TIA/TE]   Current medicines are reviewed at length with the patient today.   The patient does not have concerns regarding his medicines.  The following changes were made today: None  Labs/ tests ordered today include:  Orders Placed This Encounter  Procedures  . EKG 12-Lead     Disposition:   FU with Ruby Dilone 1  year  Signed, Aunisty Reali Meredith Leeds, MD is 07/06/2018 4:05 PM     [place] [address] [phone] (office) [phone] (fax)

## 2018-07-07 LAB — CUP PACEART INCLINIC DEVICE CHECK
Battery Remaining Longevity: 55 mo
Battery Voltage: 2.99 V
Brady Statistic AP VP Percent: 11.47 %
Date Time Interrogation Session: 20190930201711
Implantable Lead Implant Date: 20161007
Implantable Lead Implant Date: 20161007
Implantable Lead Location: 753859
Implantable Lead Model: 5076
Implantable Pulse Generator Implant Date: 20161007
Lead Channel Impedance Value: 323 Ohm
Lead Channel Impedance Value: 437 Ohm
Lead Channel Pacing Threshold Pulse Width: 0.4 ms
Lead Channel Sensing Intrinsic Amplitude: 0.125 mV
Lead Channel Setting Pacing Amplitude: 2.5 V
Lead Channel Setting Pacing Pulse Width: 0.4 ms
Lead Channel Setting Sensing Sensitivity: 4 mV
MDC IDC LEAD LOCATION: 753860
MDC IDC MSMT LEADCHNL RV IMPEDANCE VALUE: 456 Ohm
MDC IDC MSMT LEADCHNL RV IMPEDANCE VALUE: 551 Ohm
MDC IDC MSMT LEADCHNL RV PACING THRESHOLD AMPLITUDE: 0.5 V
MDC IDC STAT BRADY AP VS PERCENT: 0.01 %
MDC IDC STAT BRADY AS VP PERCENT: 88.21 %
MDC IDC STAT BRADY AS VS PERCENT: 0.32 %
MDC IDC STAT BRADY RA PERCENT PACED: 10.81 %
MDC IDC STAT BRADY RV PERCENT PACED: 99.63 %

## 2018-08-05 DIAGNOSIS — Z1389 Encounter for screening for other disorder: Secondary | ICD-10-CM | POA: Diagnosis not present

## 2018-08-05 DIAGNOSIS — Z950 Presence of cardiac pacemaker: Secondary | ICD-10-CM | POA: Diagnosis not present

## 2018-08-05 DIAGNOSIS — R972 Elevated prostate specific antigen [PSA]: Secondary | ICD-10-CM | POA: Diagnosis not present

## 2018-08-05 DIAGNOSIS — Z0000 Encounter for general adult medical examination without abnormal findings: Secondary | ICD-10-CM | POA: Diagnosis not present

## 2018-08-05 DIAGNOSIS — I482 Chronic atrial fibrillation, unspecified: Secondary | ICD-10-CM | POA: Diagnosis not present

## 2018-08-05 DIAGNOSIS — R7303 Prediabetes: Secondary | ICD-10-CM | POA: Diagnosis not present

## 2018-08-05 DIAGNOSIS — I351 Nonrheumatic aortic (valve) insufficiency: Secondary | ICD-10-CM | POA: Diagnosis not present

## 2018-08-05 DIAGNOSIS — Z23 Encounter for immunization: Secondary | ICD-10-CM | POA: Diagnosis not present

## 2018-08-05 DIAGNOSIS — E7800 Pure hypercholesterolemia, unspecified: Secondary | ICD-10-CM | POA: Diagnosis not present

## 2018-08-05 DIAGNOSIS — I10 Essential (primary) hypertension: Secondary | ICD-10-CM | POA: Diagnosis not present

## 2018-08-13 ENCOUNTER — Telehealth: Payer: Self-pay

## 2018-08-13 ENCOUNTER — Encounter: Payer: Medicare Other | Admitting: *Deleted

## 2018-08-13 NOTE — Telephone Encounter (Signed)
Spoke with pt and reminded pt of remote transmission that is due today. Pt verbalized understanding.   

## 2018-08-24 ENCOUNTER — Ambulatory Visit (INDEPENDENT_AMBULATORY_CARE_PROVIDER_SITE_OTHER): Payer: Medicare Other

## 2018-08-24 DIAGNOSIS — I442 Atrioventricular block, complete: Secondary | ICD-10-CM | POA: Diagnosis not present

## 2018-08-24 NOTE — Progress Notes (Signed)
Remote pacemaker transmission.   

## 2018-08-27 ENCOUNTER — Encounter: Payer: Self-pay | Admitting: Cardiology

## 2018-10-20 LAB — CUP PACEART REMOTE DEVICE CHECK
Battery Voltage: 3 V
Brady Statistic AS VP Percent: 0 %
Brady Statistic AS VS Percent: 0 %
Implantable Lead Location: 753859
Implantable Lead Location: 753860
Implantable Lead Model: 5076
Lead Channel Impedance Value: 437 Ohm
Lead Channel Impedance Value: 475 Ohm
Lead Channel Pacing Threshold Amplitude: 0.625 V
Lead Channel Pacing Threshold Pulse Width: 0.4 ms
Lead Channel Sensing Intrinsic Amplitude: 0.125 mV
Lead Channel Sensing Intrinsic Amplitude: 0.25 mV
Lead Channel Sensing Intrinsic Amplitude: 11.25 mV
Lead Channel Setting Pacing Amplitude: 2.5 V
Lead Channel Setting Pacing Pulse Width: 0.4 ms
MDC IDC LEAD IMPLANT DT: 20161007
MDC IDC LEAD IMPLANT DT: 20161007
MDC IDC MSMT BATTERY REMAINING LONGEVITY: 70 mo
MDC IDC MSMT LEADCHNL RA IMPEDANCE VALUE: 323 Ohm
MDC IDC MSMT LEADCHNL RV IMPEDANCE VALUE: 551 Ohm
MDC IDC MSMT LEADCHNL RV SENSING INTR AMPL: 11.25 mV
MDC IDC PG IMPLANT DT: 20161007
MDC IDC SESS DTM: 20191117225859
MDC IDC SET LEADCHNL RV SENSING SENSITIVITY: 4 mV
MDC IDC STAT BRADY AP VP PERCENT: 0 %
MDC IDC STAT BRADY AP VS PERCENT: 0 %
MDC IDC STAT BRADY RA PERCENT PACED: 0 %
MDC IDC STAT BRADY RV PERCENT PACED: 99.58 %

## 2018-11-23 ENCOUNTER — Ambulatory Visit (INDEPENDENT_AMBULATORY_CARE_PROVIDER_SITE_OTHER): Payer: Medicare Other

## 2018-11-23 DIAGNOSIS — I442 Atrioventricular block, complete: Secondary | ICD-10-CM | POA: Diagnosis not present

## 2018-11-24 LAB — CUP PACEART REMOTE DEVICE CHECK
Brady Statistic AP VP Percent: 0 %
Brady Statistic RA Percent Paced: 0 %
Brady Statistic RV Percent Paced: 99.21 %
Implantable Lead Implant Date: 20161007
Implantable Lead Implant Date: 20161007
Implantable Lead Location: 753860
Implantable Lead Model: 5076
Implantable Pulse Generator Implant Date: 20161007
Lead Channel Impedance Value: 437 Ohm
Lead Channel Impedance Value: 475 Ohm
Lead Channel Impedance Value: 551 Ohm
Lead Channel Sensing Intrinsic Amplitude: 0.25 mV
Lead Channel Setting Pacing Pulse Width: 0.4 ms
MDC IDC LEAD LOCATION: 753859
MDC IDC MSMT BATTERY REMAINING LONGEVITY: 70 mo
MDC IDC MSMT BATTERY VOLTAGE: 3 V
MDC IDC MSMT LEADCHNL RA IMPEDANCE VALUE: 323 Ohm
MDC IDC MSMT LEADCHNL RA SENSING INTR AMPL: 0.125 mV
MDC IDC MSMT LEADCHNL RV PACING THRESHOLD AMPLITUDE: 0.625 V
MDC IDC MSMT LEADCHNL RV PACING THRESHOLD PULSEWIDTH: 0.4 ms
MDC IDC MSMT LEADCHNL RV SENSING INTR AMPL: 12.125 mV
MDC IDC MSMT LEADCHNL RV SENSING INTR AMPL: 12.125 mV
MDC IDC SESS DTM: 20200217144958
MDC IDC SET LEADCHNL RV PACING AMPLITUDE: 2.5 V
MDC IDC SET LEADCHNL RV SENSING SENSITIVITY: 4 mV
MDC IDC STAT BRADY AP VS PERCENT: 0 %
MDC IDC STAT BRADY AS VP PERCENT: 0 %
MDC IDC STAT BRADY AS VS PERCENT: 0 %

## 2018-12-02 NOTE — Progress Notes (Signed)
Remote pacemaker transmission.   

## 2018-12-16 DIAGNOSIS — D485 Neoplasm of uncertain behavior of skin: Secondary | ICD-10-CM | POA: Diagnosis not present

## 2018-12-17 DIAGNOSIS — L723 Sebaceous cyst: Secondary | ICD-10-CM | POA: Diagnosis not present

## 2019-02-18 DIAGNOSIS — H2513 Age-related nuclear cataract, bilateral: Secondary | ICD-10-CM | POA: Diagnosis not present

## 2019-02-18 DIAGNOSIS — H5203 Hypermetropia, bilateral: Secondary | ICD-10-CM | POA: Diagnosis not present

## 2019-02-22 ENCOUNTER — Ambulatory Visit (INDEPENDENT_AMBULATORY_CARE_PROVIDER_SITE_OTHER): Payer: Medicare Other | Admitting: *Deleted

## 2019-02-22 ENCOUNTER — Other Ambulatory Visit: Payer: Self-pay

## 2019-02-22 DIAGNOSIS — I442 Atrioventricular block, complete: Secondary | ICD-10-CM | POA: Diagnosis not present

## 2019-02-23 LAB — CUP PACEART REMOTE DEVICE CHECK
Battery Remaining Longevity: 71 mo
Battery Voltage: 3 V
Brady Statistic AP VP Percent: 0 %
Brady Statistic AP VS Percent: 0 %
Brady Statistic AS VP Percent: 0 %
Brady Statistic AS VS Percent: 0 %
Brady Statistic RA Percent Paced: 0 %
Brady Statistic RV Percent Paced: 96.35 %
Date Time Interrogation Session: 20200518114949
Implantable Lead Implant Date: 20161007
Implantable Lead Implant Date: 20161007
Implantable Lead Location: 753859
Implantable Lead Location: 753860
Implantable Lead Model: 5076
Implantable Lead Model: 5076
Implantable Pulse Generator Implant Date: 20161007
Lead Channel Impedance Value: 323 Ohm
Lead Channel Impedance Value: 437 Ohm
Lead Channel Impedance Value: 475 Ohm
Lead Channel Impedance Value: 589 Ohm
Lead Channel Pacing Threshold Amplitude: 0.5 V
Lead Channel Pacing Threshold Pulse Width: 0.4 ms
Lead Channel Sensing Intrinsic Amplitude: 0.125 mV
Lead Channel Sensing Intrinsic Amplitude: 0.25 mV
Lead Channel Sensing Intrinsic Amplitude: 13.5 mV
Lead Channel Sensing Intrinsic Amplitude: 13.5 mV
Lead Channel Setting Pacing Amplitude: 2.5 V
Lead Channel Setting Pacing Pulse Width: 0.4 ms
Lead Channel Setting Sensing Sensitivity: 4 mV

## 2019-03-04 ENCOUNTER — Encounter: Payer: Self-pay | Admitting: Cardiology

## 2019-03-04 NOTE — Progress Notes (Signed)
Remote pacemaker transmission.   

## 2019-05-24 ENCOUNTER — Ambulatory Visit (INDEPENDENT_AMBULATORY_CARE_PROVIDER_SITE_OTHER): Payer: Medicare Other | Admitting: *Deleted

## 2019-05-24 DIAGNOSIS — I442 Atrioventricular block, complete: Secondary | ICD-10-CM | POA: Diagnosis not present

## 2019-05-24 DIAGNOSIS — I4891 Unspecified atrial fibrillation: Secondary | ICD-10-CM

## 2019-05-24 LAB — CUP PACEART REMOTE DEVICE CHECK
Battery Remaining Longevity: 62 mo
Battery Voltage: 3 V
Brady Statistic AP VP Percent: 0 %
Brady Statistic AP VS Percent: 0 %
Brady Statistic AS VP Percent: 0 %
Brady Statistic AS VS Percent: 0 %
Brady Statistic RA Percent Paced: 0 %
Brady Statistic RV Percent Paced: 98.32 %
Date Time Interrogation Session: 20200817134621
Implantable Lead Implant Date: 20161007
Implantable Lead Implant Date: 20161007
Implantable Lead Location: 753859
Implantable Lead Location: 753860
Implantable Lead Model: 5076
Implantable Lead Model: 5076
Implantable Pulse Generator Implant Date: 20161007
Lead Channel Impedance Value: 323 Ohm
Lead Channel Impedance Value: 437 Ohm
Lead Channel Impedance Value: 475 Ohm
Lead Channel Impedance Value: 570 Ohm
Lead Channel Pacing Threshold Amplitude: 0.625 V
Lead Channel Pacing Threshold Pulse Width: 0.4 ms
Lead Channel Sensing Intrinsic Amplitude: 0.125 mV
Lead Channel Sensing Intrinsic Amplitude: 0.25 mV
Lead Channel Sensing Intrinsic Amplitude: 9 mV
Lead Channel Sensing Intrinsic Amplitude: 9 mV
Lead Channel Setting Pacing Amplitude: 2.5 V
Lead Channel Setting Pacing Pulse Width: 0.4 ms
Lead Channel Setting Sensing Sensitivity: 4 mV

## 2019-06-01 NOTE — Progress Notes (Signed)
Remote pacemaker transmission.   

## 2019-06-09 DIAGNOSIS — D485 Neoplasm of uncertain behavior of skin: Secondary | ICD-10-CM | POA: Diagnosis not present

## 2019-06-09 DIAGNOSIS — L578 Other skin changes due to chronic exposure to nonionizing radiation: Secondary | ICD-10-CM | POA: Diagnosis not present

## 2019-06-10 DIAGNOSIS — L820 Inflamed seborrheic keratosis: Secondary | ICD-10-CM | POA: Diagnosis not present

## 2019-07-06 ENCOUNTER — Ambulatory Visit (INDEPENDENT_AMBULATORY_CARE_PROVIDER_SITE_OTHER): Payer: Medicare Other | Admitting: Cardiology

## 2019-07-06 ENCOUNTER — Other Ambulatory Visit: Payer: Self-pay

## 2019-07-06 ENCOUNTER — Encounter: Payer: Self-pay | Admitting: Cardiology

## 2019-07-06 VITALS — BP 142/78 | HR 71 | Ht 77.0 in | Wt 225.0 lb

## 2019-07-06 DIAGNOSIS — I442 Atrioventricular block, complete: Secondary | ICD-10-CM

## 2019-07-06 NOTE — Progress Notes (Signed)
Electrophysiology Office Note   Date:  07/06/2019   ID:  Duane Mercado, DOB [birth year], MRN [id number]  PCP:  Wenda Low, MD  Cardiologist:  Fransico Him Primary Electrophysiologist:  Asha Grumbine Meredith Leeds, MD    No chief complaint on file.    History of Present Illness: Duane Mercado is a 78 y.o. male who presents today for electrophysiology evaluation.  He has a history of coronary calcification on CT with a negative Myoview, sleep apnea not on CPAP, atrial flutter status post multiple cardioversions in 2003 and 2007. On the night of 07/11/15 he had significant fatigue and felt very imbalanced. He noticed slightly worsening right-sided m weakness when he walked. His speech was also slurred according to his family. He was brought to [facility] for further evaluation. He was noted to have atrial fibrillation of unknown duration and a slow ventricular response in the 30s. At that hospitalization he had a dual chamber pacemaker placed. Neurology at that time could not rule out MRI negative cerebral ischemia. He was placed on Eliquis at the time due to his TIA.   Today, denies symptoms of palpitations, chest pain, shortness of breath, orthopnea, PND, lower extremity edema, claudication, dizziness, presyncope, syncope, bleeding, or neurologic sequela. The patient is tolerating medications without difficulties.  He feels well.  He has no chest pain or shortness of breath.  Is able to do all of his daily activities without restriction.   Past Medical History:  Diagnosis Date  . Asthma    w seasonal rhinitis  . Coronary artery calcification seen on CAT scan    nomal nuclear stress test  . Hx of cardiovascular stress test    coronary artery calcifications with normal nuclear study in 2010  . Paroxysmal atrial flutter ([place])   . Presence of permanent cardiac pacemaker   . Sigmoid diverticulosis   . Stroke [facility])    TIA "a year and a half ago"  . Tubulovillous adenoma polyp of colon    Past  Surgical History:  Procedure Laterality Date  . COLONOSCOPY WITH PROPOFOL N/A 10/01/2016   Procedure: COLONOSCOPY WITH PROPOFOL;  Surgeon: [name], MD;  Location: [facility];  Service: Endoscopy;  Laterality: N/A;  . EP IMPLANTABLE DEVICE N/A 07/13/2015   Procedure: Pacemaker Implant;  Surgeon: Berlin Viereck Meredith Leeds, MD;  Location: [place] CV LAB;  Service: Cardiovascular;  Laterality: N/A;  . INGUINAL HERNIA REPAIR    . tubulovillous adenomatous  12/2008   polyps removed colonoscopically     Current Outpatient Medications  Medication Sig Dispense Refill  . apixaban (ELIQUIS) 5 MG TABS tablet Take 1 tablet (5 mg total) by mouth 2 (two) times daily. Start on Monday-07/17/15 120 tablet 0  . atorvastatin (LIPITOR) 10 MG tablet Take 10 mg by mouth every evening.  2  . Coenzyme Q10 (COQ10 PO) Take 400 mg by mouth daily.     [name] lisinopril (PRINIVIL,ZESTRIL) 20 MG tablet Take 1 tablet (20 mg total) by mouth daily. 90 tablet 0  . loratadine (CLARITIN) 10 MG tablet Take 10 mg by mouth daily as needed for allergies.    . Multiple Vitamin (MULTIVITAMIN WITH MINERALS) TABS tablet Take 1 tablet by mouth daily.    . tadalafil (CIALIS) 5 MG tablet Take 5 mg by mouth daily.     No current facility-administered medications for this visit.     Allergies:   Patient has no known allergies.   Social History:  The patient  reports that he has never smoked.  He has never used smokeless tobacco. He reports current alcohol use of about 1.0 standard drinks of alcohol per week. He reports that he does not use drugs.   Family History:  The patient's family history includes Heart disease in his father; Hypertension in his father and mother; Melanoma in his father.   ROS:  Please see the history of present illness.   Otherwise, review of systems is positive for none.   All other systems are reviewed and negative.   PHYSICAL EXAM: VS:  There were no vitals taken for this visit. , BMI There is no height  or weight on file to calculate BMI. GEN: Well nourished, well developed, in no acute distress  HEENT: normal  Neck: no JVD, carotid bruits, or masses Cardiac: RRR; no murmurs, rubs, or gallops,no edema  Respiratory:  clear to auscultation bilaterally, normal work of breathing GI: soft, nontender, nondistended, + BS MS: no deformity or atrophy  Skin: warm and dry, device site well healed Neuro:  Strength and sensation are intact Psych: euthymic mood, full affect  EKG:  EKG is ordered today. Personal review of the ekg ordered shows atrial fibrillation, ventricular paced  Personal review of the device interrogation today. Results in [place]: No results found for requested labs within last 8760 hours.    Lipid Panel     Component Value Date/Time   CHOL 167 07/13/2015 0704   TRIG 64 07/13/2015 0704   HDL 46 07/13/2015 0704   CHOLHDL 3.6 07/13/2015 0704   VLDL 13 07/13/2015 0704   LDLCALC 108 (H) 07/13/2015 0704     Wt Readings from Last 3 Encounters:  07/06/18 227 lb (103 kg)  03/25/18 226 lb (102.5 kg)  05/15/17 232 lb 3.2 oz (105.3 kg)      Other studies Reviewed: Additional studies/ records that were reviewed today include: TTE 07/13/15 Review of the above records today demonstrates:  - Left ventricle: The cavity size was normal. Wall thickness was increased in a pattern of moderate LVH. Systolic function was normal. The estimated ejection fraction was in the range of 50% to 55%. Wall motion was normal; there were no regional wall motion abnormalities. - Aortic valve: There was mild regurgitation. - Left atrium: The atrium was moderately dilated. - Right atrium: The atrium was moderately dilated.   ASSESSMENT AND PLAN:  1.  Complete heart block: Status post Medtronic dual-chamber pacemaker implanted 07/13/2015.  Device functioning appropriately.  No changes.   2. Persistent atrial fibrillation: Currently on Eliquis.  Plan for a rate control  strategy. This patients CHA2DS2-VASc Score and unadjusted Ischemic Stroke Rate (% per year) is equal to 4.8 % stroke rate/year from a score of 4  Above score calculated as 1 point each if present [CHF, HTN, DM, Vascular=MI/PAD/Aortic Plaque, Age if 65-74, or Male] Above score calculated as 2 points each if present [Age > 75, or Stroke/TIA/TE]   Current medicines are reviewed at length with the patient today.   The patient does not have concerns regarding his medicines.  The following changes were made today: None  Labs/ tests ordered today include:  No orders of the defined types were placed in this encounter.    Disposition:   FU with Gavan Nordby 1  year  Signed, Shatha Hooser Meredith Leeds, MD is 07/06/2019 3:43 PM     [place] [address] [place] [address] [phone] (office) [phone] (fax)

## 2019-08-18 DIAGNOSIS — Z23 Encounter for immunization: Secondary | ICD-10-CM | POA: Diagnosis not present

## 2019-08-18 DIAGNOSIS — G459 Transient cerebral ischemic attack, unspecified: Secondary | ICD-10-CM | POA: Diagnosis not present

## 2019-08-18 DIAGNOSIS — I351 Nonrheumatic aortic (valve) insufficiency: Secondary | ICD-10-CM | POA: Diagnosis not present

## 2019-08-18 DIAGNOSIS — I10 Essential (primary) hypertension: Secondary | ICD-10-CM | POA: Diagnosis not present

## 2019-08-18 DIAGNOSIS — E7800 Pure hypercholesterolemia, unspecified: Secondary | ICD-10-CM | POA: Diagnosis not present

## 2019-08-18 DIAGNOSIS — Z1389 Encounter for screening for other disorder: Secondary | ICD-10-CM | POA: Diagnosis not present

## 2019-08-18 DIAGNOSIS — R972 Elevated prostate specific antigen [PSA]: Secondary | ICD-10-CM | POA: Diagnosis not present

## 2019-08-18 DIAGNOSIS — Z950 Presence of cardiac pacemaker: Secondary | ICD-10-CM | POA: Diagnosis not present

## 2019-08-18 DIAGNOSIS — I482 Chronic atrial fibrillation, unspecified: Secondary | ICD-10-CM | POA: Diagnosis not present

## 2019-08-18 DIAGNOSIS — R7309 Other abnormal glucose: Secondary | ICD-10-CM | POA: Diagnosis not present

## 2019-08-18 DIAGNOSIS — Z0000 Encounter for general adult medical examination without abnormal findings: Secondary | ICD-10-CM | POA: Diagnosis not present

## 2019-08-23 ENCOUNTER — Ambulatory Visit (INDEPENDENT_AMBULATORY_CARE_PROVIDER_SITE_OTHER): Payer: Medicare Other | Admitting: *Deleted

## 2019-08-23 DIAGNOSIS — I4891 Unspecified atrial fibrillation: Secondary | ICD-10-CM

## 2019-08-23 DIAGNOSIS — I442 Atrioventricular block, complete: Secondary | ICD-10-CM

## 2019-08-26 LAB — CUP PACEART REMOTE DEVICE CHECK
Battery Remaining Longevity: 64 mo
Battery Voltage: 3 V
Brady Statistic AP VP Percent: 0 %
Brady Statistic AP VS Percent: 0 %
Brady Statistic AS VP Percent: 100 %
Brady Statistic AS VS Percent: 0 %
Brady Statistic RA Percent Paced: 0 %
Brady Statistic RV Percent Paced: 99.87 %
Date Time Interrogation Session: 20201119163459
Implantable Lead Implant Date: 20161007
Implantable Lead Implant Date: 20161007
Implantable Lead Location: 753859
Implantable Lead Location: 753860
Implantable Lead Model: 5076
Implantable Lead Model: 5076
Implantable Pulse Generator Implant Date: 20161007
Lead Channel Impedance Value: 323 Ohm
Lead Channel Impedance Value: 418 Ohm
Lead Channel Impedance Value: 437 Ohm
Lead Channel Impedance Value: 532 Ohm
Lead Channel Pacing Threshold Amplitude: 0.5 V
Lead Channel Pacing Threshold Pulse Width: 0.4 ms
Lead Channel Sensing Intrinsic Amplitude: 0.25 mV
Lead Channel Sensing Intrinsic Amplitude: 0.25 mV
Lead Channel Sensing Intrinsic Amplitude: 10.875 mV
Lead Channel Sensing Intrinsic Amplitude: 10.875 mV
Lead Channel Setting Pacing Amplitude: 2.5 V
Lead Channel Setting Pacing Pulse Width: 0.4 ms
Lead Channel Setting Sensing Sensitivity: 4 mV

## 2019-09-18 NOTE — Progress Notes (Signed)
Remote pacemaker transmission.   

## 2019-11-06 DIAGNOSIS — Z7189 Other specified counseling: Secondary | ICD-10-CM | POA: Diagnosis not present

## 2019-11-06 DIAGNOSIS — Z20828 Contact with and (suspected) exposure to other viral communicable diseases: Secondary | ICD-10-CM | POA: Diagnosis not present

## 2019-11-22 ENCOUNTER — Ambulatory Visit (INDEPENDENT_AMBULATORY_CARE_PROVIDER_SITE_OTHER): Payer: Medicare Other | Admitting: *Deleted

## 2019-11-22 DIAGNOSIS — I442 Atrioventricular block, complete: Secondary | ICD-10-CM

## 2019-11-25 LAB — CUP PACEART REMOTE DEVICE CHECK
Battery Remaining Longevity: 66 mo
Battery Voltage: 3 V
Brady Statistic AP VP Percent: 0 %
Brady Statistic AP VS Percent: 0 %
Brady Statistic AS VP Percent: 0 %
Brady Statistic AS VS Percent: 0 %
Brady Statistic RA Percent Paced: 0 %
Brady Statistic RV Percent Paced: 99.79 %
Date Time Interrogation Session: 20210218134855
Implantable Lead Implant Date: 20161007
Implantable Lead Implant Date: 20161007
Implantable Lead Location: 753859
Implantable Lead Location: 753860
Implantable Lead Model: 5076
Implantable Lead Model: 5076
Implantable Pulse Generator Implant Date: 20161007
Lead Channel Impedance Value: 323 Ohm
Lead Channel Impedance Value: 418 Ohm
Lead Channel Impedance Value: 475 Ohm
Lead Channel Impedance Value: 608 Ohm
Lead Channel Pacing Threshold Amplitude: 0.625 V
Lead Channel Pacing Threshold Pulse Width: 0.4 ms
Lead Channel Sensing Intrinsic Amplitude: 0.25 mV
Lead Channel Sensing Intrinsic Amplitude: 0.25 mV
Lead Channel Sensing Intrinsic Amplitude: 12.25 mV
Lead Channel Sensing Intrinsic Amplitude: 12.25 mV
Lead Channel Setting Pacing Amplitude: 2.5 V
Lead Channel Setting Pacing Pulse Width: 0.4 ms
Lead Channel Setting Sensing Sensitivity: 4 mV

## 2019-11-25 NOTE — Progress Notes (Signed)
PPM Remote  

## 2020-02-21 ENCOUNTER — Ambulatory Visit (INDEPENDENT_AMBULATORY_CARE_PROVIDER_SITE_OTHER): Payer: Medicare Other | Admitting: *Deleted

## 2020-02-21 DIAGNOSIS — I442 Atrioventricular block, complete: Secondary | ICD-10-CM | POA: Diagnosis not present

## 2020-02-21 DIAGNOSIS — I495 Sick sinus syndrome: Secondary | ICD-10-CM

## 2020-02-21 LAB — CUP PACEART REMOTE DEVICE CHECK
Battery Remaining Longevity: 57 mo
Battery Voltage: 2.99 V
Brady Statistic AP VP Percent: 0 %
Brady Statistic AP VS Percent: 0 %
Brady Statistic AS VP Percent: 0 %
Brady Statistic AS VS Percent: 0 %
Brady Statistic RA Percent Paced: 0 %
Brady Statistic RV Percent Paced: 99.65 %
Date Time Interrogation Session: 20210516164704
Implantable Lead Implant Date: 20161007
Implantable Lead Implant Date: 20161007
Implantable Lead Location: 753859
Implantable Lead Location: 753860
Implantable Lead Model: 5076
Implantable Lead Model: 5076
Implantable Pulse Generator Implant Date: 20161007
Lead Channel Impedance Value: 323 Ohm
Lead Channel Impedance Value: 418 Ohm
Lead Channel Impedance Value: 475 Ohm
Lead Channel Impedance Value: 570 Ohm
Lead Channel Pacing Threshold Amplitude: 0.625 V
Lead Channel Pacing Threshold Pulse Width: 0.4 ms
Lead Channel Sensing Intrinsic Amplitude: 0.25 mV
Lead Channel Sensing Intrinsic Amplitude: 0.25 mV
Lead Channel Sensing Intrinsic Amplitude: 9.875 mV
Lead Channel Sensing Intrinsic Amplitude: 9.875 mV
Lead Channel Setting Pacing Amplitude: 2.5 V
Lead Channel Setting Pacing Pulse Width: 0.4 ms
Lead Channel Setting Sensing Sensitivity: 4 mV

## 2020-02-22 NOTE — Progress Notes (Signed)
Remote pacemaker transmission.   

## 2020-02-24 DIAGNOSIS — H5203 Hypermetropia, bilateral: Secondary | ICD-10-CM | POA: Diagnosis not present

## 2020-02-24 DIAGNOSIS — H2513 Age-related nuclear cataract, bilateral: Secondary | ICD-10-CM | POA: Diagnosis not present

## 2020-03-07 DIAGNOSIS — L239 Allergic contact dermatitis, unspecified cause: Secondary | ICD-10-CM | POA: Diagnosis not present

## 2020-05-22 ENCOUNTER — Ambulatory Visit (INDEPENDENT_AMBULATORY_CARE_PROVIDER_SITE_OTHER): Payer: Medicare Other | Admitting: *Deleted

## 2020-05-22 DIAGNOSIS — I442 Atrioventricular block, complete: Secondary | ICD-10-CM | POA: Diagnosis not present

## 2020-05-22 LAB — CUP PACEART REMOTE DEVICE CHECK
Battery Remaining Longevity: 50 mo
Battery Voltage: 2.98 V
Brady Statistic AP VP Percent: 0 %
Brady Statistic AP VS Percent: 0 %
Brady Statistic AS VP Percent: 0 %
Brady Statistic AS VS Percent: 0 %
Brady Statistic RA Percent Paced: 0 %
Brady Statistic RV Percent Paced: 99.8 %
Date Time Interrogation Session: 20210816073238
Implantable Lead Implant Date: 20161007
Implantable Lead Implant Date: 20161007
Implantable Lead Location: 753859
Implantable Lead Location: 753860
Implantable Lead Model: 5076
Implantable Lead Model: 5076
Implantable Pulse Generator Implant Date: 20161007
Lead Channel Impedance Value: 323 Ohm
Lead Channel Impedance Value: 418 Ohm
Lead Channel Impedance Value: 475 Ohm
Lead Channel Impedance Value: 551 Ohm
Lead Channel Pacing Threshold Amplitude: 0.5 V
Lead Channel Pacing Threshold Pulse Width: 0.4 ms
Lead Channel Sensing Intrinsic Amplitude: 0.25 mV
Lead Channel Sensing Intrinsic Amplitude: 0.25 mV
Lead Channel Sensing Intrinsic Amplitude: 13.125 mV
Lead Channel Sensing Intrinsic Amplitude: 13.125 mV
Lead Channel Setting Pacing Amplitude: 2.5 V
Lead Channel Setting Pacing Pulse Width: 0.4 ms
Lead Channel Setting Sensing Sensitivity: 4 mV

## 2020-05-24 NOTE — Progress Notes (Signed)
Remote pacemaker transmission.   

## 2020-06-16 DIAGNOSIS — Z23 Encounter for immunization: Secondary | ICD-10-CM | POA: Diagnosis not present

## 2020-07-14 DIAGNOSIS — Z23 Encounter for immunization: Secondary | ICD-10-CM | POA: Diagnosis not present

## 2020-07-20 ENCOUNTER — Encounter: Payer: Self-pay | Admitting: Cardiology

## 2020-07-20 ENCOUNTER — Ambulatory Visit (INDEPENDENT_AMBULATORY_CARE_PROVIDER_SITE_OTHER): Payer: Medicare Other | Admitting: Cardiology

## 2020-07-20 ENCOUNTER — Other Ambulatory Visit: Payer: Self-pay

## 2020-07-20 VITALS — BP 140/80 | HR 61 | Ht 77.0 in | Wt 228.0 lb

## 2020-07-20 DIAGNOSIS — I4821 Permanent atrial fibrillation: Secondary | ICD-10-CM

## 2020-07-20 DIAGNOSIS — I442 Atrioventricular block, complete: Secondary | ICD-10-CM | POA: Diagnosis not present

## 2020-07-20 NOTE — Progress Notes (Signed)
Electrophysiology Office Note   Date:  07/20/2020   ID:  Mathews Robinsons, DOB 1941/03/14, MRN [id number]  PCP:  Wenda Low, MD  Cardiologist:  Fransico Him Primary Electrophysiologist:  Lana Flaim Meredith Leeds, MD    No chief complaint on file.    History of Present Illness: Duane Mercado is a 79 y.o. male who presents today for electrophysiology evaluation.  He has a history of coronary calcification on CT with negative Myoview, sleep apnea, though not on CPAP, atrial flutter status post multiple cardioversions.  On 07/31/2015 he had significant fatigue and felt imbalance.  He also felt worsening right sided weakness when he walked.  His speech was slurred.  He was brought to [facility].  He was noted to be in atrial fibrillation with slow ventricular response and heart rates in the 30s.  During that hospitalization he had a Medtronic dual-chamber pacemaker implanted.  He was placed on Eliquis at the time due to his TIA.    Today, denies symptoms of palpitations, chest pain, shortness of breath, orthopnea, PND, lower extremity edema, claudication, dizziness, presyncope, syncope, bleeding, or neurologic sequela. The patient is tolerating medications without difficulties.  Since last being seen he has done well.  He has no chest pain or shortness of breath.  Is able do all of his daily symptoms.  He does have a little bit of fatigue towards the end of the day which is likely due to his permanent atrial fibrillation.  Otherwise he has no complaints.    Past Medical History:  Diagnosis Date  . Asthma    w seasonal rhinitis  . Coronary artery calcification seen on CAT scan    nomal nuclear stress test  . Hx of cardiovascular stress test    coronary artery calcifications with normal nuclear study in 2010  . Paroxysmal atrial flutter ([place])   . Presence of permanent cardiac pacemaker   . Sigmoid diverticulosis   . Stroke [facility])    TIA "a year and a half ago"  . Tubulovillous adenoma  polyp of colon    Past Surgical History:  Procedure Laterality Date  . COLONOSCOPY WITH PROPOFOL N/A 10/01/2016   Procedure: COLONOSCOPY WITH PROPOFOL;  Surgeon: [name], MD;  [facility];  Service: Endoscopy;  Laterality: N/A;  . EP IMPLANTABLE DEVICE N/A 07/13/2015   Procedure: Pacemaker Implant;  Surgeon: Emmarie Sannes Meredith Leeds, MD;  Location: [place] CV LAB;  Service: Cardiovascular;  Laterality: N/A;  . INGUINAL HERNIA REPAIR    . tubulovillous adenomatous  12/2008   polyps removed colonoscopically     Current Outpatient Medications  Medication Sig Dispense Refill  . apixaban (ELIQUIS) 5 MG TABS tablet Take 1 tablet (5 mg total) by mouth 2 (two) times daily. Start on Monday-07/17/15 120 tablet 0  . atorvastatin (LIPITOR) 10 MG tablet Take 10 mg by mouth every evening.  2  . Coenzyme Q10 (COQ10 PO) Take 400 mg by mouth daily.     [name] lisinopril (PRINIVIL,ZESTRIL) 20 MG tablet Take 1 tablet (20 mg total) by mouth daily. 90 tablet 0  . loratadine (CLARITIN) 10 MG tablet Take 10 mg by mouth daily as needed for allergies.    . Multiple Vitamin (MULTIVITAMIN WITH MINERALS) TABS tablet Take 1 tablet by mouth daily.     No current facility-administered medications for this visit.    Allergies:   Patient has no known allergies.   Social History:  The patient  reports that he has never smoked. He has  never used smokeless tobacco. He reports current alcohol use of about 1.0 standard drink of alcohol per week. He reports that he does not use drugs.   Family History:  The patient's family history includes Heart disease in his father; Hypertension in his father and mother; Melanoma in his father.   ROS:  Please see the history of present illness.   Otherwise, review of systems is positive for none.   All other systems are reviewed and negative.   PHYSICAL EXAM: VS:  BP 140/80   Pulse 61   Ht 6\' 5"  (1.956 m)   Wt 228 lb (103.4 kg)   SpO2 94%   BMI 27.04 kg/m  , BMI  Body mass index is 27.04 kg/m. GEN: Well nourished, well developed, in no acute distress  HEENT: normal  Neck: no JVD, carotid bruits, or masses Cardiac: RRR; no murmurs, rubs, or gallops,no edema  Respiratory:  clear to auscultation bilaterally, normal work of breathing GI: soft, nontender, nondistended, + BS MS: no deformity or atrophy  Skin: warm and dry, device site well healed Neuro:  Strength and sensation are intact Psych: euthymic mood, full affect  EKG:  EKG is ordered today. Personal review of the ekg ordered shows atrial fibrillation, ventricular paced  Personal review of the device interrogation today. Results in [place]: No results found for requested labs within last 8760 hours.    Lipid Panel     Component Value Date/Time   CHOL 167 07/13/2015 0704   TRIG 64 07/13/2015 0704   HDL 46 07/13/2015 0704   CHOLHDL 3.6 07/13/2015 0704   VLDL 13 07/13/2015 0704   LDLCALC 108 (H) 07/13/2015 0704     Wt Readings from Last 3 Encounters:  07/20/20 228 lb (103.4 kg)  07/06/19 225 lb (102.1 kg)  07/06/18 227 lb (103 kg)      Other studies Reviewed: Additional studies/ records that were reviewed today include: TTE 07/13/15 Review of the above records today demonstrates:  - Left ventricle: The cavity size was normal. Wall thickness was increased in a pattern of moderate LVH. Systolic function was normal. The estimated ejection fraction was in the range of 50% to 55%. Wall motion was normal; there were no regional wall motion abnormalities. - Aortic valve: There was mild regurgitation. - Left atrium: The atrium was moderately dilated. - Right atrium: The atrium was moderately dilated.   ASSESSMENT AND PLAN:  1.  Complete heart block: Status post Medtronic dual-chamber pacemaker implanted 07/13/2015.  Device functioning appropriately.  No changes.    2.  Persistent atrial fibrillation: Currently on Eliquis.  CHA2DS2-VASc of 4.  Has a rate  control strategy and is likely permanent at this point.     Current medicines are reviewed at length with the patient today.   The patient does not have concerns regarding his medicines.  The following changes were made today: none  Labs/ tests ordered today include:  Orders Placed This Encounter  Procedures  . EKG 12-Lead     Disposition:   FU with Sereniti Wan 1 year  Signed, Zahari Xiang Meredith Leeds, MD is 07/20/2020 2:47 PM     [place] [address] [phone] (office) [phone] (fax)

## 2020-08-21 ENCOUNTER — Ambulatory Visit (INDEPENDENT_AMBULATORY_CARE_PROVIDER_SITE_OTHER): Payer: Medicare Other

## 2020-08-21 DIAGNOSIS — I442 Atrioventricular block, complete: Secondary | ICD-10-CM

## 2020-08-23 DIAGNOSIS — Z1389 Encounter for screening for other disorder: Secondary | ICD-10-CM | POA: Diagnosis not present

## 2020-08-23 DIAGNOSIS — E7800 Pure hypercholesterolemia, unspecified: Secondary | ICD-10-CM | POA: Diagnosis not present

## 2020-08-23 DIAGNOSIS — G459 Transient cerebral ischemic attack, unspecified: Secondary | ICD-10-CM | POA: Diagnosis not present

## 2020-08-23 DIAGNOSIS — I10 Essential (primary) hypertension: Secondary | ICD-10-CM | POA: Diagnosis not present

## 2020-08-23 DIAGNOSIS — I482 Chronic atrial fibrillation, unspecified: Secondary | ICD-10-CM | POA: Diagnosis not present

## 2020-08-23 DIAGNOSIS — I2510 Atherosclerotic heart disease of native coronary artery without angina pectoris: Secondary | ICD-10-CM | POA: Diagnosis not present

## 2020-08-23 DIAGNOSIS — R7309 Other abnormal glucose: Secondary | ICD-10-CM | POA: Diagnosis not present

## 2020-08-23 DIAGNOSIS — D6869 Other thrombophilia: Secondary | ICD-10-CM | POA: Diagnosis not present

## 2020-08-23 DIAGNOSIS — I351 Nonrheumatic aortic (valve) insufficiency: Secondary | ICD-10-CM | POA: Diagnosis not present

## 2020-08-23 DIAGNOSIS — Z950 Presence of cardiac pacemaker: Secondary | ICD-10-CM | POA: Diagnosis not present

## 2020-08-23 DIAGNOSIS — Z0000 Encounter for general adult medical examination without abnormal findings: Secondary | ICD-10-CM | POA: Diagnosis not present

## 2020-08-23 LAB — CUP PACEART REMOTE DEVICE CHECK
Battery Remaining Longevity: 51 mo
Battery Voltage: 2.98 V
Brady Statistic AP VP Percent: 0 %
Brady Statistic AP VS Percent: 0 %
Brady Statistic AS VP Percent: 0 %
Brady Statistic AS VS Percent: 0 %
Brady Statistic RA Percent Paced: 0 %
Brady Statistic RV Percent Paced: 99.88 %
Date Time Interrogation Session: 20211116172213
Implantable Lead Implant Date: 20161007
Implantable Lead Implant Date: 20161007
Implantable Lead Location: 753859
Implantable Lead Location: 753860
Implantable Lead Model: 5076
Implantable Lead Model: 5076
Implantable Pulse Generator Implant Date: 20161007
Lead Channel Impedance Value: 361 Ohm
Lead Channel Impedance Value: 399 Ohm
Lead Channel Impedance Value: 456 Ohm
Lead Channel Impedance Value: 513 Ohm
Lead Channel Pacing Threshold Amplitude: 0.5 V
Lead Channel Pacing Threshold Pulse Width: 0.4 ms
Lead Channel Sensing Intrinsic Amplitude: 0.25 mV
Lead Channel Sensing Intrinsic Amplitude: 0.25 mV
Lead Channel Sensing Intrinsic Amplitude: 12.625 mV
Lead Channel Sensing Intrinsic Amplitude: 12.625 mV
Lead Channel Setting Pacing Amplitude: 2.5 V
Lead Channel Setting Pacing Pulse Width: 0.4 ms
Lead Channel Setting Sensing Sensitivity: 4 mV

## 2020-08-23 NOTE — Progress Notes (Signed)
Remote pacemaker transmission.   

## 2020-11-20 ENCOUNTER — Ambulatory Visit (INDEPENDENT_AMBULATORY_CARE_PROVIDER_SITE_OTHER): Payer: Medicare Other

## 2020-11-20 DIAGNOSIS — I442 Atrioventricular block, complete: Secondary | ICD-10-CM | POA: Diagnosis not present

## 2020-11-20 LAB — CUP PACEART REMOTE DEVICE CHECK
Date Time Interrogation Session: 20220214112027
Implantable Lead Implant Date: 20161007
Implantable Lead Implant Date: 20161007
Implantable Lead Location: 753859
Implantable Lead Location: 753860
Implantable Lead Model: 5076
Implantable Lead Model: 5076
Implantable Pulse Generator Implant Date: 20161007

## 2020-11-27 NOTE — Progress Notes (Signed)
Remote pacemaker transmission.   

## 2020-12-05 DIAGNOSIS — H524 Presbyopia: Secondary | ICD-10-CM | POA: Diagnosis not present

## 2020-12-05 DIAGNOSIS — H2513 Age-related nuclear cataract, bilateral: Secondary | ICD-10-CM | POA: Diagnosis not present

## 2020-12-19 DIAGNOSIS — L738 Other specified follicular disorders: Secondary | ICD-10-CM | POA: Diagnosis not present

## 2021-02-12 ENCOUNTER — Telehealth: Payer: Self-pay

## 2021-02-12 ENCOUNTER — Other Ambulatory Visit: Payer: Self-pay | Admitting: Physician Assistant

## 2021-02-12 DIAGNOSIS — I10 Essential (primary) hypertension: Secondary | ICD-10-CM

## 2021-02-12 DIAGNOSIS — U071 COVID-19: Secondary | ICD-10-CM

## 2021-02-12 DIAGNOSIS — I442 Atrioventricular block, complete: Secondary | ICD-10-CM

## 2021-02-12 DIAGNOSIS — G459 Transient cerebral ischemic attack, unspecified: Secondary | ICD-10-CM

## 2021-02-12 DIAGNOSIS — I4821 Permanent atrial fibrillation: Secondary | ICD-10-CM

## 2021-02-12 NOTE — Progress Notes (Signed)
I connected by phone with Duane Mercado on 02/12/2021 at 10:22 AM to discuss the potential use of a new treatment for mild to moderate COVID-19 viral infection in non-hospitalized patients.  This patient is a 80 y.o. male that meets the FDA criteria for Emergency Use Authorization of COVID monoclonal antibody bebtelovimab.  Has a (+) direct SARS-CoV-2 viral test result  Has mild or moderate COVID-19   Is NOT hospitalized due to COVID-19  Is within 10 days of symptom onset  Has at least one of the high risk factor(s) for progression to severe COVID-19 and/or hospitalization as defined in EUA.  Specific high risk criteria : Older age (>/= 81 yo), BMI > 25 and Cardiovascular disease or hypertension   I have spoken and communicated the following to the patient or parent/caregiver regarding COVID monoclonal antibody treatment:  1. FDA has authorized the emergency use for the treatment of mild to moderate COVID-19 in adults and pediatric patients with positive results of direct SARS-CoV-2 viral testing who are 23 years of age and older weighing at least 40 kg, and who are at high risk for progressing to severe COVID-19 and/or hospitalization.  2. The significant known and potential risks and benefits of COVID monoclonal antibody, and the extent to which such potential risks and benefits are unknown.  3. Information on available alternative treatments and the risks and benefits of those alternatives, including clinical trials.  4. Patients treated with COVID monoclonal antibody should continue to self-isolate and use infection control measures (e.g., wear mask, isolate, social distance, avoid sharing personal items, clean and disinfect "high touch" surfaces, and frequent handwashing) according to CDC guidelines.   5. The patient or parent/caregiver has the option to accept or refuse COVID monoclonal antibody treatment.  6. Discussion about the monoclonal antibody infusion does not ensure treatment.  The patient will be placed on a list and scheduled according to risk, symptom onset and availability. A scheduler will reach to the patient to let them know if we can accommodate their infusion or not.  After reviewing this information with the patient, the patient has agreed to receive one of the available covid 19 monoclonal antibodies and will be provided an appropriate fact sheet prior to infusion.   [place], [place] 02/12/2021 10:22 AM

## 2021-02-12 NOTE — Telephone Encounter (Addendum)
Called to discuss with patient about COVID-19 symptoms and the use of one of the available treatments for those with mild to moderate Covid symptoms and at a high risk of hospitalization.  Pt appears to qualify for outpatient treatment due to co-morbid conditions and/or a member of an at-risk group in accordance with the FDA Emergency Use Authorization.    Symptom onset: 02/10/21 Chills,runny nose,cough Vaccinated: Yes Booster? Yes Immunocompromised? No Qualifiers: HTN,TIA,Heart block,Takes Eliquis NIH Criteria: Tier 3   Pt. Would like to speak with APP.  Duane Mercado

## 2021-02-14 ENCOUNTER — Telehealth: Payer: Self-pay

## 2021-02-16 ENCOUNTER — Ambulatory Visit (INDEPENDENT_AMBULATORY_CARE_PROVIDER_SITE_OTHER): Payer: Medicare Other

## 2021-02-16 DIAGNOSIS — U071 COVID-19: Secondary | ICD-10-CM | POA: Diagnosis not present

## 2021-02-16 MED ORDER — SODIUM CHLORIDE 0.9 % IV SOLN
INTRAVENOUS | Status: AC | PRN
Start: 2021-02-16 — End: ?

## 2021-02-16 MED ORDER — ALBUTEROL SULFATE HFA 108 (90 BASE) MCG/ACT IN AERS: 2.0000 | INHALATION_SPRAY | Freq: Once | RESPIRATORY_TRACT | Status: AC | PRN

## 2021-02-16 MED ORDER — DIPHENHYDRAMINE HCL 50 MG/ML IJ SOLN: 50.0000 mg | Freq: Once | INTRAMUSCULAR | Status: AC | PRN

## 2021-02-16 MED ORDER — METHYLPREDNISOLONE SODIUM SUCC 125 MG IJ SOLR: 125.0000 mg | Freq: Once | INTRAMUSCULAR | Status: AC | PRN

## 2021-02-16 MED ORDER — EPINEPHRINE 0.3 MG/0.3ML IJ SOAJ: 0.3000 mg | Freq: Once | INTRAMUSCULAR | Status: AC | PRN

## 2021-02-16 MED ORDER — FAMOTIDINE IN NACL 20-0.9 MG/50ML-% IV SOLN
20.0000 mg | Freq: Once | INTRAVENOUS | Status: AC | PRN
Start: 2021-02-16 — End: 2021-02-16

## 2021-02-16 MED ORDER — BEBTELOVIMAB 175 MG/2 ML IV (EUA)
175.0000 mg | Freq: Once | INTRAMUSCULAR | Status: AC
  Administered 2021-02-16: 175 mg via INTRAVENOUS

## 2021-02-16 NOTE — Progress Notes (Signed)
Diagnosis: COVID  Provider:  Marshell Garfinkel, MD  Procedure: Infusion  IV Type: Peripheral, IV Location: R Hand  Bebtelovimab, Dose: 175 mg  Infusion Start Time: 1340  Infusion Stop Time: 7322  Post Infusion IV Care: Observation period completed and Peripheral IV Discontinued  Discharge: Condition: Good, Destination: Home . AVS provided to patient.   Performed by:  Janine Ores, RN

## 2021-02-16 NOTE — Patient Instructions (Signed)
10 Things You Can Do to Manage Your COVID-19 Symptoms at Home If you have possible or confirmed COVID-19: 1. Stay home except to get medical care. 2. Monitor your symptoms carefully. If your symptoms get worse, call your healthcare provider immediately. 3. Get rest and stay hydrated. 4. If you have a medical appointment, call the healthcare provider ahead of time and tell them that you have or may have COVID-19. 5. For medical emergencies, call 911 and notify the dispatch personnel that you have or may have COVID-19. 6. Cover your cough and sneezes with a tissue or use the inside of your elbow. 7. Wash your hands often with soap and water for at least 20 seconds or clean your hands with an alcohol-based hand sanitizer that contains at least 60% alcohol. 8. As much as possible, stay in a specific room and away from other people in your home. Also, you should use a separate bathroom, if available. If you need to be around other people in or outside of the home, wear a mask. 9. Avoid sharing personal items with other people in your household, like dishes, towels, and bedding. 10. Clean all surfaces that are touched often, like counters, tabletops, and doorknobs. Use household cleaning sprays or wipes according to the label instructions. cdc.gov/coronavirus 04/21/2020 This information is not intended to replace advice given to you by your health care provider. Make sure you discuss any questions you have with your health care provider. Document Revised: 08/07/2020 Document Reviewed: 08/07/2020 Elsevier Patient Education  2021 Elsevier Inc.  What types of side effects do monoclonal antibody drugs cause?  Common side effects  In general, the more common side effects caused by monoclonal antibody drugs include: . Allergic reactions, such as hives or itching . Flu-like signs and symptoms, including chills, fatigue, fever, and muscle aches and pains . Nausea, vomiting . Diarrhea . Skin  rashes . Low blood pressure   The CDC is recommending patients who receive monoclonal antibody treatments wait at least 90 days before being vaccinated.  Currently, there are no data on the safety and efficacy of mRNA COVID-19 vaccines in persons who received monoclonal antibodies or convalescent plasma as part of COVID-19 treatment. Based on the estimated half-life of such therapies as well as evidence suggesting that reinfection is uncommon in the 90 days after initial infection, vaccination should be deferred for at least 90 days, as a precautionary measure until additional information becomes available, to avoid interference of the antibody treatment with vaccine-induced immune responses.   If someone you know is interested in receiving treatment please have them contact their MD for a referral or visit [email or web address]    

## 2021-02-19 ENCOUNTER — Ambulatory Visit (INDEPENDENT_AMBULATORY_CARE_PROVIDER_SITE_OTHER): Payer: Medicare Other

## 2021-02-19 DIAGNOSIS — I442 Atrioventricular block, complete: Secondary | ICD-10-CM | POA: Diagnosis not present

## 2021-02-28 LAB — CUP PACEART REMOTE DEVICE CHECK
Battery Remaining Longevity: 49 mo
Battery Voltage: 2.98 V
Brady Statistic AP VP Percent: 0 %
Brady Statistic AP VS Percent: 0 %
Brady Statistic AS VP Percent: 0 %
Brady Statistic AS VS Percent: 0 %
Brady Statistic RA Percent Paced: 0 %
Brady Statistic RV Percent Paced: 99.78 %
Date Time Interrogation Session: 20220519105908
Implantable Lead Implant Date: 20161007
Implantable Lead Implant Date: 20161007
Implantable Lead Location: 753859
Implantable Lead Location: 753860
Implantable Lead Model: 5076
Implantable Lead Model: 5076
Implantable Pulse Generator Implant Date: 20161007
Lead Channel Impedance Value: 361 Ohm
Lead Channel Impedance Value: 399 Ohm
Lead Channel Impedance Value: 456 Ohm
Lead Channel Impedance Value: 532 Ohm
Lead Channel Pacing Threshold Amplitude: 0.75 V
Lead Channel Pacing Threshold Pulse Width: 0.4 ms
Lead Channel Sensing Intrinsic Amplitude: 0.25 mV
Lead Channel Sensing Intrinsic Amplitude: 0.25 mV
Lead Channel Sensing Intrinsic Amplitude: 7.875 mV
Lead Channel Sensing Intrinsic Amplitude: 7.875 mV
Lead Channel Setting Pacing Amplitude: 2.5 V
Lead Channel Setting Pacing Pulse Width: 0.4 ms
Lead Channel Setting Sensing Sensitivity: 4 mV

## 2021-03-02 ENCOUNTER — Telehealth: Payer: Self-pay | Admitting: *Deleted

## 2021-03-02 MED ORDER — METOPROLOL SUCCINATE ER 50 MG PO TB24: 50.0000 mg | ORAL_TABLET | Freq: Every day | ORAL | 6 refills | Status: DC

## 2021-03-02 NOTE — Telephone Encounter (Signed)
Patient is returning call.  °

## 2021-03-02 NOTE — Telephone Encounter (Signed)
Returned pt call. Aware of recommendation. Rx sent to pharmacy. Medication information sent via mychart. Advised to call if SE begin after staring. Patient verbalized understanding and agreeable to plan.

## 2021-03-02 NOTE — Telephone Encounter (Signed)
Patient returning call. He is concerned about the message he received.

## 2021-03-02 NOTE — Telephone Encounter (Signed)
-----   Message from Will Meredith Leeds, MD sent at 02/28/2021  3:01 PM EDT ----- Abnormal device interrogation reviewed.  Lead parameters and battery status stable.  Short episodes of nonsustained VT.  Start Toprol-XL 50 mg.

## 2021-03-02 NOTE — Telephone Encounter (Signed)
Left message to call back  

## 2021-03-14 NOTE — Progress Notes (Signed)
Remote pacemaker transmission.   

## 2021-05-21 ENCOUNTER — Ambulatory Visit (INDEPENDENT_AMBULATORY_CARE_PROVIDER_SITE_OTHER): Payer: Medicare Other

## 2021-05-21 DIAGNOSIS — I442 Atrioventricular block, complete: Secondary | ICD-10-CM | POA: Diagnosis not present

## 2021-05-22 LAB — CUP PACEART REMOTE DEVICE CHECK
Battery Remaining Longevity: 42 mo
Battery Voltage: 2.97 V
Brady Statistic AP VP Percent: 0 %
Brady Statistic AP VS Percent: 0 %
Brady Statistic AS VP Percent: 0 %
Brady Statistic AS VS Percent: 0 %
Brady Statistic RA Percent Paced: 0 %
Brady Statistic RV Percent Paced: 99.73 %
Date Time Interrogation Session: 20220816082253
Implantable Lead Implant Date: 20161007
Implantable Lead Implant Date: 20161007
Implantable Lead Location: 753859
Implantable Lead Location: 753860
Implantable Lead Model: 5076
Implantable Lead Model: 5076
Implantable Pulse Generator Implant Date: 20161007
Lead Channel Impedance Value: 361 Ohm
Lead Channel Impedance Value: 418 Ohm
Lead Channel Impedance Value: 456 Ohm
Lead Channel Impedance Value: 551 Ohm
Lead Channel Pacing Threshold Amplitude: 0.625 V
Lead Channel Pacing Threshold Pulse Width: 0.4 ms
Lead Channel Sensing Intrinsic Amplitude: 0.25 mV
Lead Channel Sensing Intrinsic Amplitude: 0.25 mV
Lead Channel Sensing Intrinsic Amplitude: 14 mV
Lead Channel Sensing Intrinsic Amplitude: 14 mV
Lead Channel Setting Pacing Amplitude: 2.5 V
Lead Channel Setting Pacing Pulse Width: 0.4 ms
Lead Channel Setting Sensing Sensitivity: 4 mV

## 2021-06-09 NOTE — Progress Notes (Signed)
Remote pacemaker transmission.   

## 2021-06-26 DIAGNOSIS — H2513 Age-related nuclear cataract, bilateral: Secondary | ICD-10-CM | POA: Diagnosis not present

## 2021-06-26 DIAGNOSIS — H524 Presbyopia: Secondary | ICD-10-CM | POA: Diagnosis not present

## 2021-07-30 ENCOUNTER — Ambulatory Visit (INDEPENDENT_AMBULATORY_CARE_PROVIDER_SITE_OTHER): Payer: Medicare Other | Admitting: Cardiology

## 2021-07-30 ENCOUNTER — Other Ambulatory Visit: Payer: Self-pay

## 2021-07-30 ENCOUNTER — Encounter: Payer: Self-pay | Admitting: Cardiology

## 2021-07-30 DIAGNOSIS — I442 Atrioventricular block, complete: Secondary | ICD-10-CM | POA: Diagnosis not present

## 2021-07-30 LAB — CUP PACEART INCLINIC DEVICE CHECK
Battery Remaining Longevity: 46 mo
Battery Voltage: 2.97 V
Brady Statistic AP VP Percent: 0 %
Brady Statistic AP VS Percent: 0 %
Brady Statistic AS VP Percent: 0 %
Brady Statistic AS VS Percent: 0 %
Brady Statistic RA Percent Paced: 0 %
Brady Statistic RV Percent Paced: 99.82 %
Date Time Interrogation Session: 20221024162826
Implantable Lead Implant Date: 20161007
Implantable Lead Implant Date: 20161007
Implantable Lead Location: 753859
Implantable Lead Location: 753860
Implantable Lead Model: 5076
Implantable Lead Model: 5076
Implantable Pulse Generator Implant Date: 20161007
Lead Channel Impedance Value: 361 Ohm
Lead Channel Impedance Value: 418 Ohm
Lead Channel Impedance Value: 456 Ohm
Lead Channel Impedance Value: 532 Ohm
Lead Channel Pacing Threshold Amplitude: 0.75 V
Lead Channel Pacing Threshold Pulse Width: 0.4 ms
Lead Channel Sensing Intrinsic Amplitude: 0.25 mV
Lead Channel Sensing Intrinsic Amplitude: 0.25 mV
Lead Channel Sensing Intrinsic Amplitude: 10.875 mV
Lead Channel Sensing Intrinsic Amplitude: 13.75 mV
Lead Channel Setting Pacing Amplitude: 2.5 V
Lead Channel Setting Pacing Pulse Width: 0.4 ms
Lead Channel Setting Sensing Sensitivity: 4 mV

## 2021-07-30 NOTE — Patient Instructions (Addendum)
Medication Instructions:  Your physician recommends that you continue on your current medications as directed. Please refer to the Current Medication list given to you today.  *If you need a refill on your cardiac medications before your next appointment, please call your pharmacy*   Lab Work: None ordered   Testing/Procedures: None ordered   Follow-Up: At [facility], you and your health needs are our priority.  As part of our continuing mission to provide you with exceptional heart care, we have created designated Provider Care Teams.  These Care Teams include your primary Cardiologist (physician) and Advanced Practice Providers (APPs -  Physician Assistants and Nurse Practitioners) who all work together to provide you with the care you need, when you need it.  Your next appointment:   1 year(s)  The format for your next appointment:   In Person  Provider:   Allegra Lai, MD    Thank you for choosing [facility]!!   Trinidad Curet, RN [phone]     [place] #  [phone]

## 2021-07-30 NOTE — Progress Notes (Signed)
Electrophysiology Office Note   Date:  07/30/2021   ID:  Duane Mercado, DOB 15-Nov-1940, MRN [id number]  PCP:  Wenda Low, MD  Cardiologist:  Fransico Him Primary Electrophysiologist:  Abbegayle Denault Meredith Leeds, MD    No chief complaint on file.    History of Present Illness: Duane Mercado is a 80 y.o. male who presents today for electrophysiology evaluation.    He has a history of coronary calcifications on CT with a negative Myoview, sleep apnea not on CPAP, atrial flutter status post multiple cardioversions.  On 07/31/2015 he developed fatigue and imbalance.  He noted right-sided weakness when walking.  His speech was slurred.  He came to [facility] was noted to be in atrial fibrillation with slow ventricular response and heart rates in the 30s.  During hospitalization he had Medtronic dual-chamber pacemaker placed.  He was placed on Eliquis at the time due to his TIA.  Today, denies symptoms of palpitations, chest pain, shortness of breath, orthopnea, PND, lower extremity edema, claudication, dizziness, presyncope, syncope, bleeding, or neurologic sequela. The patient is tolerating medications without difficulties.  He has been feeling well.  He has no chest pain or shortness of breath.  He is able to do all of his daily activities without restriction.  Past Medical History:  Diagnosis Date   Asthma    w seasonal rhinitis   Coronary artery calcification seen on CAT scan    nomal nuclear stress test   Hx of cardiovascular stress test    coronary artery calcifications with normal nuclear study in 2010   Paroxysmal atrial flutter ([place])    Presence of permanent cardiac pacemaker    Sigmoid diverticulosis    Stroke [facility])    TIA "a year and a half ago"   Tubulovillous adenoma polyp of colon    Past Surgical History:  Procedure Laterality Date   COLONOSCOPY WITH PROPOFOL N/A 10/01/2016   Procedure: COLONOSCOPY WITH PROPOFOL;  Surgeon: [name], MD;  Location: [facility]  ENDOSCOPY;  Service: Endoscopy;  Laterality: N/A;   EP IMPLANTABLE DEVICE N/A 07/13/2015   Procedure: Pacemaker Implant;  Surgeon: Sherlynn Tourville Meredith Leeds, MD;  Location: [facility];  Service: Cardiovascular;  Laterality: N/A;   INGUINAL HERNIA REPAIR     tubulovillous adenomatous  12/2008   polyps removed colonoscopically     Current Outpatient Medications  Medication Sig Dispense Refill   apixaban (ELIQUIS) 5 MG TABS tablet Take 1 tablet (5 mg total) by mouth 2 (two) times daily. Start on Monday-07/17/15 120 tablet 0   atorvastatin (LIPITOR) 10 MG tablet Take 10 mg by mouth every evening.  2   Coenzyme Q10 (COQ10 PO) Take 400 mg by mouth daily.      lisinopril (PRINIVIL,ZESTRIL) 20 MG tablet Take 1 tablet (20 mg total) by mouth daily. 90 tablet 0   loratadine (CLARITIN) 10 MG tablet Take 10 mg by mouth daily as needed for allergies.     metoprolol succinate (TOPROL-XL) 50 MG 24 hr tablet Take 1 tablet (50 mg total) by mouth daily. Take with or immediately following a meal. 30 tablet 6   Multiple Vitamin (MULTIVITAMIN WITH MINERALS) TABS tablet Take 1 tablet by mouth daily.     Current Facility-Administered Medications  Medication Dose Route Frequency Provider Last Rate Last Admin   0.9 %  sodium chloride infusion   Intravenous PRN Bhagat, Bhavinkumar, PA        Allergies:   Patient has no known allergies.   Social History:  The patient  reports that he has never smoked. He has never used smokeless tobacco. He reports current alcohol use of about 1.0 standard drink per week. He reports that he does not use drugs.   Family History:  The patient's family history includes Heart disease in his father; Hypertension in his father and mother; Melanoma in his father.   ROS:  Please see the history of present illness.   Otherwise, review of systems is positive for none.   All other systems are reviewed and negative.   PHYSICAL EXAM: VS:  BP 132/74   Pulse 74   Ht 6\' 5"  (1.956 m)   Wt  224 lb 3.2 oz (101.7 kg)   SpO2 97%   BMI 26.59 kg/m  , BMI Body mass index is 26.59 kg/m. GEN: Well nourished, well developed, in no acute distress  HEENT: normal  Neck: no JVD, carotid bruits, or masses Cardiac: RRR; no murmurs, rubs, or gallops,no edema  Respiratory:  clear to auscultation bilaterally, normal work of breathing GI: soft, nontender, nondistended, + BS MS: no deformity or atrophy  Skin: warm and dry, device site well healed Neuro:  Strength and sensation are intact Psych: euthymic mood, full affect  EKG:  EKG is ordered today. Personal review of the ekg ordered shows atrial fibrillation, ventricular paced  Personal review of the device interrogation today. Results in [place]: No results found for requested labs within last 8760 hours.    Lipid Panel     Component Value Date/Time   CHOL 167 07/13/2015 0704   TRIG 64 07/13/2015 0704   HDL 46 07/13/2015 0704   CHOLHDL 3.6 07/13/2015 0704   VLDL 13 07/13/2015 0704   LDLCALC 108 (H) 07/13/2015 0704     Wt Readings from Last 3 Encounters:  07/30/21 224 lb 3.2 oz (101.7 kg)  07/20/20 228 lb (103.4 kg)  07/06/19 225 lb (102.1 kg)      Other studies Reviewed: Additional studies/ records that were reviewed today include: TTE 07/13/15 Review of the above records today demonstrates:  - Left ventricle: The cavity size was normal. Wall thickness was   increased in a pattern of moderate LVH. Systolic function was   normal. The estimated ejection fraction was in the range of 50%   to 55%. Wall motion was normal; there were no regional wall   motion abnormalities. - Aortic valve: There was mild regurgitation. - Left atrium: The atrium was moderately dilated. - Right atrium: The atrium was moderately dilated.   ASSESSMENT AND PLAN:  1.  Complete heart block: Status post Medtronic dual-chamber pacemaker implanted 07/13/2015.  Device functioning appropriately.  No changes at this time.  2.   Permanent atrial fibrillation: Currently on Eliquis.  CHA2DS2-VASc of 4.  Is well rate controlled.    3.  Ventricular tachycardia: Minimal episodes on device interrogation.  Currently on metoprolol.  Tolerating well.  Current medicines are reviewed at length with the patient today.   The patient does not have concerns regarding his medicines.  The following changes were made today: None  Labs/ tests ordered today include:  Orders Placed This Encounter  Procedures   EKG 12-Lead      Disposition:   FU with Leveon Pelzer 1 year  Signed, Oak Dore[place] Meredith Leeds, MD is 07/30/2021 4:01 PM     [facility] [address] [place] [address] [phone] (office) [phone] (fax)

## 2021-08-20 ENCOUNTER — Ambulatory Visit (INDEPENDENT_AMBULATORY_CARE_PROVIDER_SITE_OTHER): Payer: Medicare Other

## 2021-08-20 DIAGNOSIS — I442 Atrioventricular block, complete: Secondary | ICD-10-CM | POA: Diagnosis not present

## 2021-08-21 LAB — CUP PACEART REMOTE DEVICE CHECK
Battery Remaining Longevity: 46 mo
Battery Voltage: 2.97 V
Brady Statistic AP VP Percent: 0 %
Brady Statistic AP VS Percent: 0 %
Brady Statistic AS VP Percent: 0 %
Brady Statistic AS VS Percent: 0 %
Brady Statistic RA Percent Paced: 0 %
Brady Statistic RV Percent Paced: 99.76 %
Date Time Interrogation Session: 20221114122200
Implantable Lead Implant Date: 20161007
Implantable Lead Implant Date: 20161007
Implantable Lead Location: 753859
Implantable Lead Location: 753860
Implantable Lead Model: 5076
Implantable Lead Model: 5076
Implantable Pulse Generator Implant Date: 20161007
Lead Channel Impedance Value: 361 Ohm
Lead Channel Impedance Value: 437 Ohm
Lead Channel Impedance Value: 456 Ohm
Lead Channel Impedance Value: 570 Ohm
Lead Channel Pacing Threshold Amplitude: 0.75 V
Lead Channel Pacing Threshold Pulse Width: 0.4 ms
Lead Channel Sensing Intrinsic Amplitude: 0.25 mV
Lead Channel Sensing Intrinsic Amplitude: 0.25 mV
Lead Channel Sensing Intrinsic Amplitude: 8.5 mV
Lead Channel Sensing Intrinsic Amplitude: 8.5 mV
Lead Channel Setting Pacing Amplitude: 2.5 V
Lead Channel Setting Pacing Pulse Width: 0.4 ms
Lead Channel Setting Sensing Sensitivity: 4 mV

## 2021-08-27 NOTE — Progress Notes (Signed)
Remote pacemaker transmission.   

## 2021-09-04 DIAGNOSIS — I4892 Unspecified atrial flutter: Secondary | ICD-10-CM | POA: Diagnosis not present

## 2021-09-04 DIAGNOSIS — D6869 Other thrombophilia: Secondary | ICD-10-CM | POA: Diagnosis not present

## 2021-09-04 DIAGNOSIS — E7800 Pure hypercholesterolemia, unspecified: Secondary | ICD-10-CM | POA: Diagnosis not present

## 2021-09-04 DIAGNOSIS — R972 Elevated prostate specific antigen [PSA]: Secondary | ICD-10-CM | POA: Diagnosis not present

## 2021-09-04 DIAGNOSIS — Z0000 Encounter for general adult medical examination without abnormal findings: Secondary | ICD-10-CM | POA: Diagnosis not present

## 2021-09-04 DIAGNOSIS — R7303 Prediabetes: Secondary | ICD-10-CM | POA: Diagnosis not present

## 2021-09-04 DIAGNOSIS — I2510 Atherosclerotic heart disease of native coronary artery without angina pectoris: Secondary | ICD-10-CM | POA: Diagnosis not present

## 2021-09-04 DIAGNOSIS — Z1389 Encounter for screening for other disorder: Secondary | ICD-10-CM | POA: Diagnosis not present

## 2021-09-04 DIAGNOSIS — G4733 Obstructive sleep apnea (adult) (pediatric): Secondary | ICD-10-CM | POA: Diagnosis not present

## 2021-09-04 DIAGNOSIS — I10 Essential (primary) hypertension: Secondary | ICD-10-CM | POA: Diagnosis not present

## 2021-09-04 DIAGNOSIS — Z950 Presence of cardiac pacemaker: Secondary | ICD-10-CM | POA: Diagnosis not present

## 2021-09-04 DIAGNOSIS — I351 Nonrheumatic aortic (valve) insufficiency: Secondary | ICD-10-CM | POA: Diagnosis not present

## 2021-09-04 DIAGNOSIS — G459 Transient cerebral ischemic attack, unspecified: Secondary | ICD-10-CM | POA: Diagnosis not present

## 2021-10-05 ENCOUNTER — Other Ambulatory Visit: Payer: Self-pay | Admitting: Cardiology

## 2021-10-10 DIAGNOSIS — H0011 Chalazion right upper eyelid: Secondary | ICD-10-CM | POA: Diagnosis not present

## 2021-10-10 DIAGNOSIS — H0012 Chalazion right lower eyelid: Secondary | ICD-10-CM | POA: Diagnosis not present

## 2021-11-19 ENCOUNTER — Ambulatory Visit (INDEPENDENT_AMBULATORY_CARE_PROVIDER_SITE_OTHER): Payer: Medicare Other

## 2021-11-19 DIAGNOSIS — I442 Atrioventricular block, complete: Secondary | ICD-10-CM | POA: Diagnosis not present

## 2021-11-19 LAB — CUP PACEART REMOTE DEVICE CHECK
Battery Remaining Longevity: 38 mo
Battery Voltage: 2.96 V
Brady Statistic AP VP Percent: 0 %
Brady Statistic AP VS Percent: 0 %
Brady Statistic AS VP Percent: 0 %
Brady Statistic AS VS Percent: 0 %
Brady Statistic RA Percent Paced: 0 %
Brady Statistic RV Percent Paced: 99.09 %
Date Time Interrogation Session: 20230212153013
Implantable Lead Implant Date: 20161007
Implantable Lead Implant Date: 20161007
Implantable Lead Location: 753859
Implantable Lead Location: 753860
Implantable Lead Model: 5076
Implantable Lead Model: 5076
Implantable Pulse Generator Implant Date: 20161007
Lead Channel Impedance Value: 361 Ohm
Lead Channel Impedance Value: 456 Ohm
Lead Channel Impedance Value: 456 Ohm
Lead Channel Impedance Value: 589 Ohm
Lead Channel Pacing Threshold Amplitude: 0.625 V
Lead Channel Pacing Threshold Pulse Width: 0.4 ms
Lead Channel Sensing Intrinsic Amplitude: 0.25 mV
Lead Channel Sensing Intrinsic Amplitude: 0.25 mV
Lead Channel Sensing Intrinsic Amplitude: 9.5 mV
Lead Channel Sensing Intrinsic Amplitude: 9.5 mV
Lead Channel Setting Pacing Amplitude: 2.5 V
Lead Channel Setting Pacing Pulse Width: 0.4 ms
Lead Channel Setting Sensing Sensitivity: 4 mV

## 2021-11-22 NOTE — Progress Notes (Signed)
Remote pacemaker transmission.   

## 2022-02-18 ENCOUNTER — Ambulatory Visit (INDEPENDENT_AMBULATORY_CARE_PROVIDER_SITE_OTHER): Payer: Medicare Other

## 2022-02-18 DIAGNOSIS — I442 Atrioventricular block, complete: Secondary | ICD-10-CM

## 2022-02-20 LAB — CUP PACEART REMOTE DEVICE CHECK
Battery Remaining Longevity: 34 mo
Battery Voltage: 2.96 V
Brady Statistic AP VP Percent: 0 %
Brady Statistic AP VS Percent: 0 %
Brady Statistic AS VP Percent: 0 %
Brady Statistic AS VS Percent: 0 %
Brady Statistic RA Percent Paced: 0 %
Brady Statistic RV Percent Paced: 98.33 %
Date Time Interrogation Session: 20230516120828
Implantable Lead Implant Date: 20161007
Implantable Lead Implant Date: 20161007
Implantable Lead Location: 753859
Implantable Lead Location: 753860
Implantable Lead Model: 5076
Implantable Lead Model: 5076
Implantable Pulse Generator Implant Date: 20161007
Lead Channel Impedance Value: 361 Ohm
Lead Channel Impedance Value: 456 Ohm
Lead Channel Impedance Value: 456 Ohm
Lead Channel Impedance Value: 589 Ohm
Lead Channel Pacing Threshold Amplitude: 0.75 V
Lead Channel Pacing Threshold Pulse Width: 0.4 ms
Lead Channel Sensing Intrinsic Amplitude: 0.25 mV
Lead Channel Sensing Intrinsic Amplitude: 0.25 mV
Lead Channel Sensing Intrinsic Amplitude: 8.875 mV
Lead Channel Sensing Intrinsic Amplitude: 8.875 mV
Lead Channel Setting Pacing Amplitude: 2.5 V
Lead Channel Setting Pacing Pulse Width: 0.4 ms
Lead Channel Setting Sensing Sensitivity: 4 mV

## 2022-03-11 NOTE — Progress Notes (Signed)
Remote pacemaker transmission.   

## 2022-05-20 ENCOUNTER — Ambulatory Visit: Payer: Medicare Other

## 2022-05-22 LAB — CUP PACEART REMOTE DEVICE CHECK
Battery Remaining Longevity: 34 mo
Battery Voltage: 2.95 V
Brady Statistic AP VP Percent: 0 %
Brady Statistic AP VS Percent: 0 %
Brady Statistic AS VP Percent: 0 %
Brady Statistic AS VS Percent: 0 %
Brady Statistic RA Percent Paced: 0 %
Brady Statistic RV Percent Paced: 98.14 %
Date Time Interrogation Session: 20230815164318
Implantable Lead Implant Date: 20161007
Implantable Lead Implant Date: 20161007
Implantable Lead Location: 753859
Implantable Lead Location: 753860
Implantable Lead Model: 5076
Implantable Lead Model: 5076
Implantable Pulse Generator Implant Date: 20161007
Lead Channel Impedance Value: 361 Ohm
Lead Channel Impedance Value: 437 Ohm
Lead Channel Impedance Value: 456 Ohm
Lead Channel Impedance Value: 570 Ohm
Lead Channel Pacing Threshold Amplitude: 0.625 V
Lead Channel Pacing Threshold Pulse Width: 0.4 ms
Lead Channel Sensing Intrinsic Amplitude: 0.25 mV
Lead Channel Sensing Intrinsic Amplitude: 0.25 mV
Lead Channel Sensing Intrinsic Amplitude: 9 mV
Lead Channel Sensing Intrinsic Amplitude: 9 mV
Lead Channel Setting Pacing Amplitude: 2.5 V
Lead Channel Setting Pacing Pulse Width: 0.4 ms
Lead Channel Setting Sensing Sensitivity: 4 mV

## 2022-07-04 DIAGNOSIS — H5203 Hypermetropia, bilateral: Secondary | ICD-10-CM | POA: Diagnosis not present

## 2022-07-04 DIAGNOSIS — H04123 Dry eye syndrome of bilateral lacrimal glands: Secondary | ICD-10-CM | POA: Diagnosis not present

## 2022-08-19 ENCOUNTER — Ambulatory Visit (INDEPENDENT_AMBULATORY_CARE_PROVIDER_SITE_OTHER): Payer: Medicare Other

## 2022-08-19 DIAGNOSIS — I442 Atrioventricular block, complete: Secondary | ICD-10-CM

## 2022-08-20 LAB — CUP PACEART REMOTE DEVICE CHECK
Battery Remaining Longevity: 35 mo
Battery Voltage: 2.95 V
Brady Statistic AP VP Percent: 0 %
Brady Statistic AP VS Percent: 0 %
Brady Statistic AS VP Percent: 0 %
Brady Statistic AS VS Percent: 0 %
Brady Statistic RA Percent Paced: 0 %
Brady Statistic RV Percent Paced: 94.53 %
Date Time Interrogation Session: 20231113164359
Implantable Lead Connection Status: 753985
Implantable Lead Connection Status: 753985
Implantable Lead Implant Date: 20161007
Implantable Lead Implant Date: 20161007
Implantable Lead Location: 753859
Implantable Lead Location: 753860
Implantable Lead Model: 5076
Implantable Lead Model: 5076
Implantable Pulse Generator Implant Date: 20161007
Lead Channel Impedance Value: 361 Ohm
Lead Channel Impedance Value: 437 Ohm
Lead Channel Impedance Value: 456 Ohm
Lead Channel Impedance Value: 589 Ohm
Lead Channel Pacing Threshold Amplitude: 0.75 V
Lead Channel Pacing Threshold Pulse Width: 0.4 ms
Lead Channel Sensing Intrinsic Amplitude: 0.25 mV
Lead Channel Sensing Intrinsic Amplitude: 0.25 mV
Lead Channel Sensing Intrinsic Amplitude: 8.75 mV
Lead Channel Sensing Intrinsic Amplitude: 8.75 mV
Lead Channel Setting Pacing Amplitude: 2.5 V
Lead Channel Setting Pacing Pulse Width: 0.4 ms
Lead Channel Setting Sensing Sensitivity: 4 mV
Zone Setting Status: 755011
Zone Setting Status: 755011

## 2022-09-11 DIAGNOSIS — E7800 Pure hypercholesterolemia, unspecified: Secondary | ICD-10-CM | POA: Diagnosis not present

## 2022-09-11 DIAGNOSIS — I10 Essential (primary) hypertension: Secondary | ICD-10-CM | POA: Diagnosis not present

## 2022-09-11 DIAGNOSIS — Z950 Presence of cardiac pacemaker: Secondary | ICD-10-CM | POA: Diagnosis not present

## 2022-09-11 DIAGNOSIS — G459 Transient cerebral ischemic attack, unspecified: Secondary | ICD-10-CM | POA: Diagnosis not present

## 2022-09-11 DIAGNOSIS — I482 Chronic atrial fibrillation, unspecified: Secondary | ICD-10-CM | POA: Diagnosis not present

## 2022-09-11 DIAGNOSIS — R7303 Prediabetes: Secondary | ICD-10-CM | POA: Diagnosis not present

## 2022-09-11 DIAGNOSIS — Z0000 Encounter for general adult medical examination without abnormal findings: Secondary | ICD-10-CM | POA: Diagnosis not present

## 2022-09-11 DIAGNOSIS — R972 Elevated prostate specific antigen [PSA]: Secondary | ICD-10-CM | POA: Diagnosis not present

## 2022-09-11 DIAGNOSIS — I351 Nonrheumatic aortic (valve) insufficiency: Secondary | ICD-10-CM | POA: Diagnosis not present

## 2022-09-11 DIAGNOSIS — Z1331 Encounter for screening for depression: Secondary | ICD-10-CM | POA: Diagnosis not present

## 2022-09-11 DIAGNOSIS — I2510 Atherosclerotic heart disease of native coronary artery without angina pectoris: Secondary | ICD-10-CM | POA: Diagnosis not present

## 2022-09-11 DIAGNOSIS — D6869 Other thrombophilia: Secondary | ICD-10-CM | POA: Diagnosis not present

## 2022-10-03 NOTE — Progress Notes (Signed)
Remote pacemaker transmission.   

## 2022-10-09 ENCOUNTER — Ambulatory Visit: Payer: Medicare Other | Attending: Cardiology | Admitting: Cardiology

## 2022-10-09 ENCOUNTER — Encounter: Payer: Self-pay | Admitting: Cardiology

## 2022-10-09 VITALS — BP 124/74 | HR 66 | Ht 77.0 in | Wt 225.0 lb

## 2022-10-09 DIAGNOSIS — D6869 Other thrombophilia: Secondary | ICD-10-CM | POA: Diagnosis not present

## 2022-10-09 DIAGNOSIS — I442 Atrioventricular block, complete: Secondary | ICD-10-CM | POA: Diagnosis not present

## 2022-10-09 DIAGNOSIS — I4821 Permanent atrial fibrillation: Secondary | ICD-10-CM | POA: Insufficient documentation

## 2022-10-09 NOTE — Progress Notes (Signed)
Electrophysiology Office Note   Date:  10/09/2022   ID:  Duane Mercado, DOB Apr 01, 1941, MRN [id number]  PCP:  Wenda Low, MD  Cardiologist:  Fransico Him Primary Electrophysiologist:  Kathye Cipriani Meredith Leeds, MD    No chief complaint on file.     History of Present Illness: Duane Mercado is a 82 y.o. male who presents today for electrophysiology evaluation.    He has a history significant for coronary calcifications on CTA with negative Myoview, sleep apnea not on CPAP, atrial flutter post multiple cardioversions.  On 07/31/2015 he developed weakness and imbalance.  He was noted to have right-sided weakness with walking and had slurred speech.  He came to the hospital was noted to be in atrial fibrillation with slow ventricular response.  He is status post Medtronic dual-chamber pacemaker.  He was put on Eliquis at the time due to his TIA.  Today, denies symptoms of palpitations, chest pain, shortness of breath, orthopnea, PND, lower extremity edema, claudication, dizziness, presyncope, syncope, bleeding, or neurologic sequela. The patient is tolerating medications without difficulties.     Past Medical History:  Diagnosis Date   Asthma    w seasonal rhinitis   Coronary artery calcification seen on CAT scan    nomal nuclear stress test   Hx of cardiovascular stress test    coronary artery calcifications with normal nuclear study in 2010   Paroxysmal atrial flutter ([place])    Presence of permanent cardiac pacemaker    Sigmoid diverticulosis    Stroke [facility])    TIA "a year and a half ago"   Tubulovillous adenoma polyp of colon    Past Surgical History:  Procedure Laterality Date   COLONOSCOPY WITH PROPOFOL N/A 10/01/2016   Procedure: COLONOSCOPY WITH PROPOFOL;  Surgeon: [name], MD;  Location: [facility] ENDOSCOPY;  Service: Endoscopy;  Laterality: N/A;   EP IMPLANTABLE DEVICE N/A 07/13/2015   Procedure: Pacemaker Implant;  Surgeon: Cleola Perryman Meredith Leeds, MD;  Location: [place] [facility];  Service: Cardiovascular;  Laterality: N/A;   INGUINAL HERNIA REPAIR     tubulovillous adenomatous  12/2008   polyps removed colonoscopically     Current Outpatient Medications  Medication Sig Dispense Refill   apixaban (ELIQUIS) 5 MG TABS tablet Take 1 tablet (5 mg total) by mouth 2 (two) times daily. Start on Monday-07/17/15 120 tablet 0   atorvastatin (LIPITOR) 10 MG tablet Take 10 mg by mouth every evening.  2   Coenzyme Q10 (COQ10 PO) Take 400 mg by mouth daily.      lisinopril (PRINIVIL,ZESTRIL) 20 MG tablet Take 1 tablet (20 mg total) by mouth daily. 90 tablet 0   loratadine (CLARITIN) 10 MG tablet Take 10 mg by mouth daily as needed for allergies.     metoprolol succinate (TOPROL-XL) 50 MG 24 hr tablet TAKE 1 TABLET(50 MG) BY MOUTH DAILY WITH OR IMMEDIATELY FOLLOWING A MEAL 30 tablet 6   Multiple Vitamin (MULTIVITAMIN WITH MINERALS) TABS tablet Take 1 tablet by mouth daily.     Current Facility-Administered Medications  Medication Dose Route Frequency Provider Last Rate Last Admin   0.9 %  sodium chloride infusion   Intravenous PRN Bhagat, Bhavinkumar, PA        Allergies:   Patient has no known allergies.   Social History:  The patient  reports that he has never smoked. He has never used smokeless tobacco. He reports current alcohol use of about 1.0 standard drink of alcohol per week. He reports that he does  not use drugs.   Family History:  The patient's family history includes Heart disease in his father; Hypertension in his father and mother; Melanoma in his father.   ROS:  Please see the history of present illness.   Otherwise, review of systems is positive for none.   All other systems are reviewed and negative.   PHYSICAL EXAM: VS:  BP 124/74   Pulse 66   Ht '6\' 5"'$  (1.956 m)   Wt 225 lb (102.1 kg)   SpO2 98%   BMI 26.68 kg/m  , BMI Body mass index is 26.68 kg/m. GEN: Well nourished, well developed, in no acute distress  HEENT: normal  Neck: no JVD, carotid  bruits, or masses Cardiac: RRR; no murmurs, rubs, or gallops,no edema  Respiratory:  clear to auscultation bilaterally, normal work of breathing GI: soft, nontender, nondistended, + BS MS: no deformity or atrophy  Skin: warm and dry, device site well healed Neuro:  Strength and sensation are intact Psych: euthymic mood, full affect  EKG:  EKG is ordered today. Personal review of the ekg ordered shows AF, V paced  Personal review of the device interrogation today. Results in [place]: No results found for requested labs within last 365 days.    Lipid Panel     Component Value Date/Time   CHOL 167 07/13/2015 0704   TRIG 64 07/13/2015 0704   HDL 46 07/13/2015 0704   CHOLHDL 3.6 07/13/2015 0704   VLDL 13 07/13/2015 0704   LDLCALC 108 (H) 07/13/2015 0704     Wt Readings from Last 3 Encounters:  10/09/22 225 lb (102.1 kg)  07/30/21 224 lb 3.2 oz (101.7 kg)  07/20/20 228 lb (103.4 kg)      Other studies Reviewed: Additional studies/ records that were reviewed today include: TTE 07/13/15 Review of the above records today demonstrates:  - Left ventricle: The cavity size was normal. Wall thickness was   increased in a pattern of moderate LVH. Systolic function was   normal. The estimated ejection fraction was in the range of 50%   to 55%. Wall motion was normal; there were no regional wall   motion abnormalities. - Aortic valve: There was mild regurgitation. - Left atrium: The atrium was moderately dilated. - Right atrium: The atrium was moderately dilated.   ASSESSMENT AND PLAN:  1.  Complete heart block: Status post Medtronic dual-chamber pacemaker implanted 07/13/2015.  Device functioning appropriately.  No changes at this time.  2.  Permanent atrial fibrillation: Currently on Eliquis 5 mg twice daily.  CHA2DS2-VASc of 4.  Well rate controlled.  3.  Ventricular tachycardia: Minimal episodes on device interrogation.  Currently on metoprolol.  4.   Secondary hypercoagulable state: Currently on Eliquis for atrial fibrillation as above  Current medicines are reviewed at length with the patient today.   The patient does not have concerns regarding his medicines.  The following changes were made today: none  Labs/ tests ordered today include:  Orders Placed This Encounter  Procedures   EKG 12-Lead      Disposition:   FU 1 year  Signed, Peniel Hass Meredith Leeds, MD is 10/09/2022 3:14 PM     [facility] [place] [address] [phone] (office) [phone] (fax)

## 2022-10-16 ENCOUNTER — Encounter: Payer: Medicare Other | Admitting: Cardiology

## 2022-10-23 ENCOUNTER — Encounter: Payer: Self-pay | Admitting: Cardiology

## 2022-11-18 ENCOUNTER — Ambulatory Visit: Payer: Medicare Other

## 2022-11-18 DIAGNOSIS — I442 Atrioventricular block, complete: Secondary | ICD-10-CM | POA: Diagnosis not present

## 2022-11-19 LAB — CUP PACEART REMOTE DEVICE CHECK
Battery Remaining Longevity: 31 mo
Battery Voltage: 2.94 V
Brady Statistic AP VP Percent: 0 %
Brady Statistic AP VS Percent: 0 %
Brady Statistic AS VP Percent: 0 %
Brady Statistic AS VS Percent: 0 %
Brady Statistic RA Percent Paced: 0 %
Brady Statistic RV Percent Paced: 99.66 %
Date Time Interrogation Session: 20240212154437
Implantable Lead Connection Status: 753985
Implantable Lead Connection Status: 753985
Implantable Lead Implant Date: 20161007
Implantable Lead Implant Date: 20161007
Implantable Lead Location: 753859
Implantable Lead Location: 753860
Implantable Lead Model: 5076
Implantable Lead Model: 5076
Implantable Pulse Generator Implant Date: 20161007
Lead Channel Impedance Value: 361 Ohm
Lead Channel Impedance Value: 437 Ohm
Lead Channel Impedance Value: 456 Ohm
Lead Channel Impedance Value: 570 Ohm
Lead Channel Pacing Threshold Amplitude: 0.75 V
Lead Channel Pacing Threshold Pulse Width: 0.4 ms
Lead Channel Sensing Intrinsic Amplitude: 0.25 mV
Lead Channel Sensing Intrinsic Amplitude: 0.25 mV
Lead Channel Sensing Intrinsic Amplitude: 7 mV
Lead Channel Sensing Intrinsic Amplitude: 7 mV
Lead Channel Setting Pacing Amplitude: 2.5 V
Lead Channel Setting Pacing Pulse Width: 0.4 ms
Lead Channel Setting Sensing Sensitivity: 4 mV
Zone Setting Status: 755011
Zone Setting Status: 755011

## 2022-11-26 DIAGNOSIS — I10 Essential (primary) hypertension: Secondary | ICD-10-CM | POA: Diagnosis not present

## 2023-01-02 NOTE — Progress Notes (Signed)
Remote pacemaker transmission.   

## 2023-02-17 ENCOUNTER — Ambulatory Visit (INDEPENDENT_AMBULATORY_CARE_PROVIDER_SITE_OTHER): Payer: Medicare Other

## 2023-02-17 DIAGNOSIS — I442 Atrioventricular block, complete: Secondary | ICD-10-CM

## 2023-02-17 LAB — CUP PACEART REMOTE DEVICE CHECK
Battery Remaining Longevity: 29 mo
Battery Voltage: 2.93 V
Brady Statistic AP VP Percent: 0 %
Brady Statistic AP VS Percent: 0 %
Brady Statistic AS VP Percent: 0 %
Brady Statistic AS VS Percent: 0 %
Brady Statistic RA Percent Paced: 0 %
Brady Statistic RV Percent Paced: 99.54 %
Date Time Interrogation Session: 20240513090318
Implantable Lead Connection Status: 753985
Implantable Lead Connection Status: 753985
Implantable Lead Implant Date: 20161007
Implantable Lead Implant Date: 20161007
Implantable Lead Location: 753859
Implantable Lead Location: 753860
Implantable Lead Model: 5076
Implantable Lead Model: 5076
Implantable Pulse Generator Implant Date: 20161007
Lead Channel Impedance Value: 361 Ohm
Lead Channel Impedance Value: 380 Ohm
Lead Channel Impedance Value: 456 Ohm
Lead Channel Impedance Value: 532 Ohm
Lead Channel Pacing Threshold Amplitude: 0.75 V
Lead Channel Pacing Threshold Pulse Width: 0.4 ms
Lead Channel Sensing Intrinsic Amplitude: 0.25 mV
Lead Channel Sensing Intrinsic Amplitude: 0.25 mV
Lead Channel Sensing Intrinsic Amplitude: 8.5 mV
Lead Channel Sensing Intrinsic Amplitude: 8.5 mV
Lead Channel Setting Pacing Amplitude: 2.5 V
Lead Channel Setting Pacing Pulse Width: 0.4 ms
Lead Channel Setting Sensing Sensitivity: 4 mV
Zone Setting Status: 755011
Zone Setting Status: 755011

## 2023-02-28 DIAGNOSIS — J40 Bronchitis, not specified as acute or chronic: Secondary | ICD-10-CM | POA: Diagnosis not present

## 2023-03-10 DIAGNOSIS — H04123 Dry eye syndrome of bilateral lacrimal glands: Secondary | ICD-10-CM | POA: Diagnosis not present

## 2023-03-10 DIAGNOSIS — H2513 Age-related nuclear cataract, bilateral: Secondary | ICD-10-CM | POA: Diagnosis not present

## 2023-03-13 DIAGNOSIS — D6869 Other thrombophilia: Secondary | ICD-10-CM | POA: Diagnosis not present

## 2023-03-13 DIAGNOSIS — R7303 Prediabetes: Secondary | ICD-10-CM | POA: Diagnosis not present

## 2023-03-13 DIAGNOSIS — I10 Essential (primary) hypertension: Secondary | ICD-10-CM | POA: Diagnosis not present

## 2023-03-13 DIAGNOSIS — I4892 Unspecified atrial flutter: Secondary | ICD-10-CM | POA: Diagnosis not present

## 2023-03-13 DIAGNOSIS — I2510 Atherosclerotic heart disease of native coronary artery without angina pectoris: Secondary | ICD-10-CM | POA: Diagnosis not present

## 2023-03-13 DIAGNOSIS — E7800 Pure hypercholesterolemia, unspecified: Secondary | ICD-10-CM | POA: Diagnosis not present

## 2023-03-17 NOTE — Progress Notes (Signed)
Remote pacemaker transmission.   

## 2023-04-01 DIAGNOSIS — H2513 Age-related nuclear cataract, bilateral: Secondary | ICD-10-CM | POA: Diagnosis not present

## 2023-04-01 DIAGNOSIS — H04123 Dry eye syndrome of bilateral lacrimal glands: Secondary | ICD-10-CM | POA: Diagnosis not present

## 2023-04-02 DIAGNOSIS — M1711 Unilateral primary osteoarthritis, right knee: Secondary | ICD-10-CM | POA: Diagnosis not present

## 2023-05-19 ENCOUNTER — Ambulatory Visit: Payer: Medicare Other

## 2023-05-19 DIAGNOSIS — I442 Atrioventricular block, complete: Secondary | ICD-10-CM

## 2023-06-03 NOTE — Progress Notes (Signed)
Remote pacemaker transmission.   

## 2023-07-03 DIAGNOSIS — H2513 Age-related nuclear cataract, bilateral: Secondary | ICD-10-CM | POA: Diagnosis not present

## 2023-08-18 ENCOUNTER — Ambulatory Visit (INDEPENDENT_AMBULATORY_CARE_PROVIDER_SITE_OTHER): Payer: Medicare Other

## 2023-08-18 DIAGNOSIS — I442 Atrioventricular block, complete: Secondary | ICD-10-CM

## 2023-08-21 LAB — CUP PACEART REMOTE DEVICE CHECK
Battery Remaining Longevity: 24 mo
Battery Voltage: 2.92 V
Brady Statistic AP VP Percent: 0 %
Brady Statistic AP VS Percent: 0 %
Brady Statistic AS VP Percent: 0 %
Brady Statistic AS VS Percent: 0 %
Brady Statistic RA Percent Paced: 0 %
Brady Statistic RV Percent Paced: 99.76 %
Date Time Interrogation Session: 20241112135807
Implantable Lead Connection Status: 753985
Implantable Lead Connection Status: 753985
Implantable Lead Implant Date: 20161007
Implantable Lead Implant Date: 20161007
Implantable Lead Location: 753859
Implantable Lead Location: 753860
Implantable Lead Model: 5076
Implantable Lead Model: 5076
Implantable Pulse Generator Implant Date: 20161007
Lead Channel Impedance Value: 361 Ohm
Lead Channel Impedance Value: 399 Ohm
Lead Channel Impedance Value: 456 Ohm
Lead Channel Impedance Value: 532 Ohm
Lead Channel Pacing Threshold Amplitude: 0.625 V
Lead Channel Pacing Threshold Pulse Width: 0.4 ms
Lead Channel Sensing Intrinsic Amplitude: 0.25 mV
Lead Channel Sensing Intrinsic Amplitude: 0.25 mV
Lead Channel Sensing Intrinsic Amplitude: 7.25 mV
Lead Channel Sensing Intrinsic Amplitude: 7.25 mV
Lead Channel Setting Pacing Amplitude: 2.5 V
Lead Channel Setting Pacing Pulse Width: 0.4 ms
Lead Channel Setting Sensing Sensitivity: 4 mV
Zone Setting Status: 755011
Zone Setting Status: 755011

## 2023-09-12 NOTE — Progress Notes (Signed)
Remote pacemaker transmission.   

## 2023-09-17 DIAGNOSIS — E7800 Pure hypercholesterolemia, unspecified: Secondary | ICD-10-CM | POA: Diagnosis not present

## 2023-09-17 DIAGNOSIS — R7303 Prediabetes: Secondary | ICD-10-CM | POA: Diagnosis not present

## 2023-09-17 DIAGNOSIS — D6869 Other thrombophilia: Secondary | ICD-10-CM | POA: Diagnosis not present

## 2023-09-17 DIAGNOSIS — I351 Nonrheumatic aortic (valve) insufficiency: Secondary | ICD-10-CM | POA: Diagnosis not present

## 2023-09-17 DIAGNOSIS — G459 Transient cerebral ischemic attack, unspecified: Secondary | ICD-10-CM | POA: Diagnosis not present

## 2023-09-17 DIAGNOSIS — Z0000 Encounter for general adult medical examination without abnormal findings: Secondary | ICD-10-CM | POA: Diagnosis not present

## 2023-09-17 DIAGNOSIS — I4821 Permanent atrial fibrillation: Secondary | ICD-10-CM | POA: Diagnosis not present

## 2023-09-17 DIAGNOSIS — I2510 Atherosclerotic heart disease of native coronary artery without angina pectoris: Secondary | ICD-10-CM | POA: Diagnosis not present

## 2023-09-17 DIAGNOSIS — I10 Essential (primary) hypertension: Secondary | ICD-10-CM | POA: Diagnosis not present

## 2023-09-17 DIAGNOSIS — R972 Elevated prostate specific antigen [PSA]: Secondary | ICD-10-CM | POA: Diagnosis not present

## 2023-09-17 DIAGNOSIS — G4733 Obstructive sleep apnea (adult) (pediatric): Secondary | ICD-10-CM | POA: Diagnosis not present

## 2023-09-17 DIAGNOSIS — Z950 Presence of cardiac pacemaker: Secondary | ICD-10-CM | POA: Diagnosis not present

## 2023-10-23 DIAGNOSIS — H2513 Age-related nuclear cataract, bilateral: Secondary | ICD-10-CM | POA: Diagnosis not present

## 2023-10-23 DIAGNOSIS — H04123 Dry eye syndrome of bilateral lacrimal glands: Secondary | ICD-10-CM | POA: Diagnosis not present

## 2023-11-17 ENCOUNTER — Ambulatory Visit: Payer: Medicare Other

## 2023-11-17 DIAGNOSIS — I442 Atrioventricular block, complete: Secondary | ICD-10-CM

## 2023-11-18 LAB — CUP PACEART REMOTE DEVICE CHECK
Battery Remaining Longevity: 22 mo
Battery Voltage: 2.91 V
Brady Statistic AP VP Percent: 0 %
Brady Statistic AP VS Percent: 0 %
Brady Statistic AS VP Percent: 0 %
Brady Statistic AS VS Percent: 0 %
Brady Statistic RA Percent Paced: 0 %
Brady Statistic RV Percent Paced: 99.7 %
Date Time Interrogation Session: 20250211121634
Implantable Lead Connection Status: 753985
Implantable Lead Connection Status: 753985
Implantable Lead Implant Date: 20161007
Implantable Lead Implant Date: 20161007
Implantable Lead Location: 753859
Implantable Lead Location: 753860
Implantable Lead Model: 5076
Implantable Lead Model: 5076
Implantable Pulse Generator Implant Date: 20161007
Lead Channel Impedance Value: 361 Ohm
Lead Channel Impedance Value: 399 Ohm
Lead Channel Impedance Value: 456 Ohm
Lead Channel Impedance Value: 513 Ohm
Lead Channel Pacing Threshold Amplitude: 0.625 V
Lead Channel Pacing Threshold Pulse Width: 0.4 ms
Lead Channel Sensing Intrinsic Amplitude: 0.25 mV
Lead Channel Sensing Intrinsic Amplitude: 0.25 mV
Lead Channel Sensing Intrinsic Amplitude: 8.875 mV
Lead Channel Sensing Intrinsic Amplitude: 8.875 mV
Lead Channel Setting Pacing Amplitude: 2.5 V
Lead Channel Setting Pacing Pulse Width: 0.4 ms
Lead Channel Setting Sensing Sensitivity: 4 mV
Zone Setting Status: 755011
Zone Setting Status: 755011

## 2023-12-18 DIAGNOSIS — R972 Elevated prostate specific antigen [PSA]: Secondary | ICD-10-CM | POA: Diagnosis not present

## 2023-12-23 DIAGNOSIS — G459 Transient cerebral ischemic attack, unspecified: Secondary | ICD-10-CM | POA: Diagnosis not present

## 2023-12-23 DIAGNOSIS — J40 Bronchitis, not specified as acute or chronic: Secondary | ICD-10-CM | POA: Diagnosis not present

## 2023-12-23 DIAGNOSIS — I4821 Permanent atrial fibrillation: Secondary | ICD-10-CM | POA: Diagnosis not present

## 2023-12-23 DIAGNOSIS — I2510 Atherosclerotic heart disease of native coronary artery without angina pectoris: Secondary | ICD-10-CM | POA: Diagnosis not present

## 2023-12-26 NOTE — Progress Notes (Signed)
 Remote pacemaker transmission.

## 2023-12-26 NOTE — Addendum Note (Signed)
 Addended by: Geralyn Flash D on: 12/26/2023 04:05 PM   Modules accepted: Orders

## 2024-01-05 DIAGNOSIS — I2510 Atherosclerotic heart disease of native coronary artery without angina pectoris: Secondary | ICD-10-CM | POA: Diagnosis not present

## 2024-01-05 DIAGNOSIS — I10 Essential (primary) hypertension: Secondary | ICD-10-CM | POA: Diagnosis not present

## 2024-01-05 DIAGNOSIS — E7800 Pure hypercholesterolemia, unspecified: Secondary | ICD-10-CM | POA: Diagnosis not present

## 2024-01-05 DIAGNOSIS — G459 Transient cerebral ischemic attack, unspecified: Secondary | ICD-10-CM | POA: Diagnosis not present

## 2024-01-21 DIAGNOSIS — G459 Transient cerebral ischemic attack, unspecified: Secondary | ICD-10-CM | POA: Diagnosis not present

## 2024-01-21 DIAGNOSIS — I2510 Atherosclerotic heart disease of native coronary artery without angina pectoris: Secondary | ICD-10-CM | POA: Diagnosis not present

## 2024-01-21 DIAGNOSIS — J40 Bronchitis, not specified as acute or chronic: Secondary | ICD-10-CM | POA: Diagnosis not present

## 2024-01-21 DIAGNOSIS — I4821 Permanent atrial fibrillation: Secondary | ICD-10-CM | POA: Diagnosis not present

## 2024-02-04 DIAGNOSIS — I2510 Atherosclerotic heart disease of native coronary artery without angina pectoris: Secondary | ICD-10-CM | POA: Diagnosis not present

## 2024-02-04 DIAGNOSIS — J40 Bronchitis, not specified as acute or chronic: Secondary | ICD-10-CM | POA: Diagnosis not present

## 2024-02-04 DIAGNOSIS — I4821 Permanent atrial fibrillation: Secondary | ICD-10-CM | POA: Diagnosis not present

## 2024-02-04 DIAGNOSIS — E7800 Pure hypercholesterolemia, unspecified: Secondary | ICD-10-CM | POA: Diagnosis not present

## 2024-02-04 DIAGNOSIS — I10 Essential (primary) hypertension: Secondary | ICD-10-CM | POA: Diagnosis not present

## 2024-02-04 DIAGNOSIS — G459 Transient cerebral ischemic attack, unspecified: Secondary | ICD-10-CM | POA: Diagnosis not present

## 2024-02-16 ENCOUNTER — Ambulatory Visit (INDEPENDENT_AMBULATORY_CARE_PROVIDER_SITE_OTHER): Payer: Medicare Other

## 2024-02-16 DIAGNOSIS — I442 Atrioventricular block, complete: Secondary | ICD-10-CM

## 2024-02-18 LAB — CUP PACEART REMOTE DEVICE CHECK
Battery Remaining Longevity: 20 mo
Battery Voltage: 2.9 V
Brady Statistic AP VP Percent: 0 %
Brady Statistic AP VS Percent: 0 %
Brady Statistic AS VP Percent: 0 %
Brady Statistic AS VS Percent: 0 %
Brady Statistic RA Percent Paced: 0 %
Brady Statistic RV Percent Paced: 99.62 %
Date Time Interrogation Session: 20250513164953
Implantable Lead Connection Status: 753985
Implantable Lead Connection Status: 753985
Implantable Lead Implant Date: 20161007
Implantable Lead Implant Date: 20161007
Implantable Lead Location: 753859
Implantable Lead Location: 753860
Implantable Lead Model: 5076
Implantable Lead Model: 5076
Implantable Pulse Generator Implant Date: 20161007
Lead Channel Impedance Value: 361 Ohm
Lead Channel Impedance Value: 399 Ohm
Lead Channel Impedance Value: 456 Ohm
Lead Channel Impedance Value: 532 Ohm
Lead Channel Pacing Threshold Amplitude: 0.75 V
Lead Channel Pacing Threshold Pulse Width: 0.4 ms
Lead Channel Sensing Intrinsic Amplitude: 0.25 mV
Lead Channel Sensing Intrinsic Amplitude: 0.25 mV
Lead Channel Sensing Intrinsic Amplitude: 8 mV
Lead Channel Sensing Intrinsic Amplitude: 8 mV
Lead Channel Setting Pacing Amplitude: 2.5 V
Lead Channel Setting Pacing Pulse Width: 0.4 ms
Lead Channel Setting Sensing Sensitivity: 4 mV
Zone Setting Status: 755011
Zone Setting Status: 755011

## 2024-02-19 ENCOUNTER — Ambulatory Visit: Payer: Self-pay | Admitting: Cardiology

## 2024-02-20 DIAGNOSIS — J40 Bronchitis, not specified as acute or chronic: Secondary | ICD-10-CM | POA: Diagnosis not present

## 2024-02-20 DIAGNOSIS — I4821 Permanent atrial fibrillation: Secondary | ICD-10-CM | POA: Diagnosis not present

## 2024-02-20 DIAGNOSIS — I2510 Atherosclerotic heart disease of native coronary artery without angina pectoris: Secondary | ICD-10-CM | POA: Diagnosis not present

## 2024-02-20 DIAGNOSIS — G459 Transient cerebral ischemic attack, unspecified: Secondary | ICD-10-CM | POA: Diagnosis not present

## 2024-03-06 DIAGNOSIS — I4821 Permanent atrial fibrillation: Secondary | ICD-10-CM | POA: Diagnosis not present

## 2024-03-06 DIAGNOSIS — G459 Transient cerebral ischemic attack, unspecified: Secondary | ICD-10-CM | POA: Diagnosis not present

## 2024-03-06 DIAGNOSIS — I10 Essential (primary) hypertension: Secondary | ICD-10-CM | POA: Diagnosis not present

## 2024-03-06 DIAGNOSIS — E7800 Pure hypercholesterolemia, unspecified: Secondary | ICD-10-CM | POA: Diagnosis not present

## 2024-03-06 DIAGNOSIS — I2510 Atherosclerotic heart disease of native coronary artery without angina pectoris: Secondary | ICD-10-CM | POA: Diagnosis not present

## 2024-03-06 DIAGNOSIS — J40 Bronchitis, not specified as acute or chronic: Secondary | ICD-10-CM | POA: Diagnosis not present

## 2024-03-16 DIAGNOSIS — I351 Nonrheumatic aortic (valve) insufficiency: Secondary | ICD-10-CM | POA: Diagnosis not present

## 2024-03-16 DIAGNOSIS — I4821 Permanent atrial fibrillation: Secondary | ICD-10-CM | POA: Diagnosis not present

## 2024-03-16 DIAGNOSIS — I2510 Atherosclerotic heart disease of native coronary artery without angina pectoris: Secondary | ICD-10-CM | POA: Diagnosis not present

## 2024-03-16 DIAGNOSIS — I10 Essential (primary) hypertension: Secondary | ICD-10-CM | POA: Diagnosis not present

## 2024-03-16 DIAGNOSIS — E7800 Pure hypercholesterolemia, unspecified: Secondary | ICD-10-CM | POA: Diagnosis not present

## 2024-03-16 DIAGNOSIS — R7303 Prediabetes: Secondary | ICD-10-CM | POA: Diagnosis not present

## 2024-03-16 DIAGNOSIS — D6869 Other thrombophilia: Secondary | ICD-10-CM | POA: Diagnosis not present

## 2024-03-21 DIAGNOSIS — J40 Bronchitis, not specified as acute or chronic: Secondary | ICD-10-CM | POA: Diagnosis not present

## 2024-03-21 DIAGNOSIS — I2510 Atherosclerotic heart disease of native coronary artery without angina pectoris: Secondary | ICD-10-CM | POA: Diagnosis not present

## 2024-03-21 DIAGNOSIS — I4821 Permanent atrial fibrillation: Secondary | ICD-10-CM | POA: Diagnosis not present

## 2024-03-21 DIAGNOSIS — G459 Transient cerebral ischemic attack, unspecified: Secondary | ICD-10-CM | POA: Diagnosis not present

## 2024-04-05 DIAGNOSIS — E7800 Pure hypercholesterolemia, unspecified: Secondary | ICD-10-CM | POA: Diagnosis not present

## 2024-04-05 DIAGNOSIS — I2510 Atherosclerotic heart disease of native coronary artery without angina pectoris: Secondary | ICD-10-CM | POA: Diagnosis not present

## 2024-04-05 DIAGNOSIS — J40 Bronchitis, not specified as acute or chronic: Secondary | ICD-10-CM | POA: Diagnosis not present

## 2024-04-05 DIAGNOSIS — G459 Transient cerebral ischemic attack, unspecified: Secondary | ICD-10-CM | POA: Diagnosis not present

## 2024-04-05 DIAGNOSIS — I4821 Permanent atrial fibrillation: Secondary | ICD-10-CM | POA: Diagnosis not present

## 2024-04-05 DIAGNOSIS — I10 Essential (primary) hypertension: Secondary | ICD-10-CM | POA: Diagnosis not present

## 2024-04-06 NOTE — Addendum Note (Signed)
 Addended by: TAWNI DRILLING D on: 04/06/2024 10:12 AM   Modules accepted: Orders

## 2024-04-06 NOTE — Progress Notes (Signed)
 Remote pacemaker transmission.

## 2024-04-15 DIAGNOSIS — H524 Presbyopia: Secondary | ICD-10-CM | POA: Diagnosis not present

## 2024-04-15 DIAGNOSIS — H04123 Dry eye syndrome of bilateral lacrimal glands: Secondary | ICD-10-CM | POA: Diagnosis not present

## 2024-04-15 DIAGNOSIS — H2513 Age-related nuclear cataract, bilateral: Secondary | ICD-10-CM | POA: Diagnosis not present

## 2024-04-20 DIAGNOSIS — G459 Transient cerebral ischemic attack, unspecified: Secondary | ICD-10-CM | POA: Diagnosis not present

## 2024-04-20 DIAGNOSIS — I2510 Atherosclerotic heart disease of native coronary artery without angina pectoris: Secondary | ICD-10-CM | POA: Diagnosis not present

## 2024-04-20 DIAGNOSIS — I4821 Permanent atrial fibrillation: Secondary | ICD-10-CM | POA: Diagnosis not present

## 2024-04-20 DIAGNOSIS — J40 Bronchitis, not specified as acute or chronic: Secondary | ICD-10-CM | POA: Diagnosis not present

## 2024-05-06 DIAGNOSIS — I4821 Permanent atrial fibrillation: Secondary | ICD-10-CM | POA: Diagnosis not present

## 2024-05-06 DIAGNOSIS — G459 Transient cerebral ischemic attack, unspecified: Secondary | ICD-10-CM | POA: Diagnosis not present

## 2024-05-06 DIAGNOSIS — I2510 Atherosclerotic heart disease of native coronary artery without angina pectoris: Secondary | ICD-10-CM | POA: Diagnosis not present

## 2024-05-06 DIAGNOSIS — H04123 Dry eye syndrome of bilateral lacrimal glands: Secondary | ICD-10-CM | POA: Diagnosis not present

## 2024-05-06 DIAGNOSIS — J40 Bronchitis, not specified as acute or chronic: Secondary | ICD-10-CM | POA: Diagnosis not present

## 2024-05-06 DIAGNOSIS — E7800 Pure hypercholesterolemia, unspecified: Secondary | ICD-10-CM | POA: Diagnosis not present

## 2024-05-06 DIAGNOSIS — H2513 Age-related nuclear cataract, bilateral: Secondary | ICD-10-CM | POA: Diagnosis not present

## 2024-05-06 DIAGNOSIS — I10 Essential (primary) hypertension: Secondary | ICD-10-CM | POA: Diagnosis not present

## 2024-05-12 DIAGNOSIS — H2511 Age-related nuclear cataract, right eye: Secondary | ICD-10-CM | POA: Diagnosis not present

## 2024-05-12 DIAGNOSIS — H25811 Combined forms of age-related cataract, right eye: Secondary | ICD-10-CM | POA: Diagnosis not present

## 2024-05-17 ENCOUNTER — Ambulatory Visit: Payer: Medicare Other

## 2024-05-17 DIAGNOSIS — I442 Atrioventricular block, complete: Secondary | ICD-10-CM

## 2024-05-19 ENCOUNTER — Ambulatory Visit: Payer: Self-pay | Admitting: Cardiology

## 2024-05-19 LAB — CUP PACEART REMOTE DEVICE CHECK
Battery Remaining Longevity: 15 mo
Battery Voltage: 2.89 V
Brady Statistic AP VP Percent: 0 %
Brady Statistic AP VS Percent: 0 %
Brady Statistic AS VP Percent: 0 %
Brady Statistic AS VS Percent: 0 %
Brady Statistic RA Percent Paced: 0 %
Brady Statistic RV Percent Paced: 99.74 %
Date Time Interrogation Session: 20250812132121
Implantable Lead Connection Status: 753985
Implantable Lead Connection Status: 753985
Implantable Lead Implant Date: 20161007
Implantable Lead Implant Date: 20161007
Implantable Lead Location: 753859
Implantable Lead Location: 753860
Implantable Lead Model: 5076
Implantable Lead Model: 5076
Implantable Pulse Generator Implant Date: 20161007
Lead Channel Impedance Value: 361 Ohm
Lead Channel Impedance Value: 380 Ohm
Lead Channel Impedance Value: 456 Ohm
Lead Channel Impedance Value: 513 Ohm
Lead Channel Pacing Threshold Amplitude: 0.625 V
Lead Channel Pacing Threshold Pulse Width: 0.4 ms
Lead Channel Sensing Intrinsic Amplitude: 0.25 mV
Lead Channel Sensing Intrinsic Amplitude: 0.25 mV
Lead Channel Sensing Intrinsic Amplitude: 8.875 mV
Lead Channel Sensing Intrinsic Amplitude: 8.875 mV
Lead Channel Setting Pacing Amplitude: 2.5 V
Lead Channel Setting Pacing Pulse Width: 0.4 ms
Lead Channel Setting Sensing Sensitivity: 4 mV
Zone Setting Status: 755011
Zone Setting Status: 755011

## 2024-05-20 DIAGNOSIS — G459 Transient cerebral ischemic attack, unspecified: Secondary | ICD-10-CM | POA: Diagnosis not present

## 2024-05-20 DIAGNOSIS — I2510 Atherosclerotic heart disease of native coronary artery without angina pectoris: Secondary | ICD-10-CM | POA: Diagnosis not present

## 2024-05-20 DIAGNOSIS — I4821 Permanent atrial fibrillation: Secondary | ICD-10-CM | POA: Diagnosis not present

## 2024-05-20 DIAGNOSIS — J40 Bronchitis, not specified as acute or chronic: Secondary | ICD-10-CM | POA: Diagnosis not present

## 2024-05-26 DIAGNOSIS — H25811 Combined forms of age-related cataract, right eye: Secondary | ICD-10-CM | POA: Diagnosis not present

## 2024-05-26 DIAGNOSIS — H2512 Age-related nuclear cataract, left eye: Secondary | ICD-10-CM | POA: Diagnosis not present

## 2024-06-06 DIAGNOSIS — E7800 Pure hypercholesterolemia, unspecified: Secondary | ICD-10-CM | POA: Diagnosis not present

## 2024-06-06 DIAGNOSIS — I10 Essential (primary) hypertension: Secondary | ICD-10-CM | POA: Diagnosis not present

## 2024-06-06 DIAGNOSIS — I4821 Permanent atrial fibrillation: Secondary | ICD-10-CM | POA: Diagnosis not present

## 2024-06-06 DIAGNOSIS — I2510 Atherosclerotic heart disease of native coronary artery without angina pectoris: Secondary | ICD-10-CM | POA: Diagnosis not present

## 2024-06-06 DIAGNOSIS — J40 Bronchitis, not specified as acute or chronic: Secondary | ICD-10-CM | POA: Diagnosis not present

## 2024-06-06 DIAGNOSIS — G459 Transient cerebral ischemic attack, unspecified: Secondary | ICD-10-CM | POA: Diagnosis not present

## 2024-06-19 DIAGNOSIS — I4821 Permanent atrial fibrillation: Secondary | ICD-10-CM | POA: Diagnosis not present

## 2024-06-19 DIAGNOSIS — G459 Transient cerebral ischemic attack, unspecified: Secondary | ICD-10-CM | POA: Diagnosis not present

## 2024-06-19 DIAGNOSIS — I2510 Atherosclerotic heart disease of native coronary artery without angina pectoris: Secondary | ICD-10-CM | POA: Diagnosis not present

## 2024-06-19 DIAGNOSIS — J40 Bronchitis, not specified as acute or chronic: Secondary | ICD-10-CM | POA: Diagnosis not present

## 2024-06-21 ENCOUNTER — Other Ambulatory Visit: Payer: Self-pay | Admitting: Medical Genetics

## 2024-06-25 DIAGNOSIS — R351 Nocturia: Secondary | ICD-10-CM | POA: Diagnosis not present

## 2024-06-25 DIAGNOSIS — R52 Pain, unspecified: Secondary | ICD-10-CM | POA: Diagnosis not present

## 2024-07-02 NOTE — Progress Notes (Signed)
 Remote PPM Transmission

## 2024-07-06 DIAGNOSIS — I2510 Atherosclerotic heart disease of native coronary artery without angina pectoris: Secondary | ICD-10-CM | POA: Diagnosis not present

## 2024-07-06 DIAGNOSIS — E7800 Pure hypercholesterolemia, unspecified: Secondary | ICD-10-CM | POA: Diagnosis not present

## 2024-07-06 DIAGNOSIS — J40 Bronchitis, not specified as acute or chronic: Secondary | ICD-10-CM | POA: Diagnosis not present

## 2024-07-06 DIAGNOSIS — G459 Transient cerebral ischemic attack, unspecified: Secondary | ICD-10-CM | POA: Diagnosis not present

## 2024-07-06 DIAGNOSIS — I4821 Permanent atrial fibrillation: Secondary | ICD-10-CM | POA: Diagnosis not present

## 2024-07-06 DIAGNOSIS — I10 Essential (primary) hypertension: Secondary | ICD-10-CM | POA: Diagnosis not present

## 2024-07-19 DIAGNOSIS — I4821 Permanent atrial fibrillation: Secondary | ICD-10-CM | POA: Diagnosis not present

## 2024-07-19 DIAGNOSIS — I2510 Atherosclerotic heart disease of native coronary artery without angina pectoris: Secondary | ICD-10-CM | POA: Diagnosis not present

## 2024-07-19 DIAGNOSIS — G459 Transient cerebral ischemic attack, unspecified: Secondary | ICD-10-CM | POA: Diagnosis not present

## 2024-07-19 DIAGNOSIS — J40 Bronchitis, not specified as acute or chronic: Secondary | ICD-10-CM | POA: Diagnosis not present

## 2024-08-06 DIAGNOSIS — I10 Essential (primary) hypertension: Secondary | ICD-10-CM | POA: Diagnosis not present

## 2024-08-06 DIAGNOSIS — E7800 Pure hypercholesterolemia, unspecified: Secondary | ICD-10-CM | POA: Diagnosis not present

## 2024-08-06 DIAGNOSIS — J40 Bronchitis, not specified as acute or chronic: Secondary | ICD-10-CM | POA: Diagnosis not present

## 2024-08-06 DIAGNOSIS — I2510 Atherosclerotic heart disease of native coronary artery without angina pectoris: Secondary | ICD-10-CM | POA: Diagnosis not present

## 2024-08-06 DIAGNOSIS — G459 Transient cerebral ischemic attack, unspecified: Secondary | ICD-10-CM | POA: Diagnosis not present

## 2024-08-06 DIAGNOSIS — I4821 Permanent atrial fibrillation: Secondary | ICD-10-CM | POA: Diagnosis not present

## 2024-08-16 ENCOUNTER — Ambulatory Visit: Payer: Medicare Other

## 2024-08-16 DIAGNOSIS — I442 Atrioventricular block, complete: Secondary | ICD-10-CM

## 2024-08-17 LAB — CUP PACEART REMOTE DEVICE CHECK
Battery Remaining Longevity: 13 mo
Battery Voltage: 2.89 V
Brady Statistic AP VP Percent: 0 %
Brady Statistic AP VS Percent: 0 %
Brady Statistic AS VP Percent: 0 %
Brady Statistic AS VS Percent: 0 %
Brady Statistic RA Percent Paced: 0 %
Brady Statistic RV Percent Paced: 99.84 %
Date Time Interrogation Session: 20251111105616
Implantable Lead Connection Status: 753985
Implantable Lead Connection Status: 753985
Implantable Lead Implant Date: 20161007
Implantable Lead Implant Date: 20161007
Implantable Lead Location: 753859
Implantable Lead Location: 753860
Implantable Lead Model: 5076
Implantable Lead Model: 5076
Implantable Pulse Generator Implant Date: 20161007
Lead Channel Impedance Value: 361 Ohm
Lead Channel Impedance Value: 418 Ohm
Lead Channel Impedance Value: 456 Ohm
Lead Channel Impedance Value: 551 Ohm
Lead Channel Pacing Threshold Amplitude: 0.75 V
Lead Channel Pacing Threshold Pulse Width: 0.4 ms
Lead Channel Sensing Intrinsic Amplitude: 0.25 mV
Lead Channel Sensing Intrinsic Amplitude: 0.25 mV
Lead Channel Sensing Intrinsic Amplitude: 7.875 mV
Lead Channel Sensing Intrinsic Amplitude: 7.875 mV
Lead Channel Setting Pacing Amplitude: 2.5 V
Lead Channel Setting Pacing Pulse Width: 0.4 ms
Lead Channel Setting Sensing Sensitivity: 4 mV
Zone Setting Status: 755011
Zone Setting Status: 755011

## 2024-08-18 DIAGNOSIS — I4821 Permanent atrial fibrillation: Secondary | ICD-10-CM | POA: Diagnosis not present

## 2024-08-18 DIAGNOSIS — I2510 Atherosclerotic heart disease of native coronary artery without angina pectoris: Secondary | ICD-10-CM | POA: Diagnosis not present

## 2024-08-18 DIAGNOSIS — J40 Bronchitis, not specified as acute or chronic: Secondary | ICD-10-CM | POA: Diagnosis not present

## 2024-08-18 DIAGNOSIS — G459 Transient cerebral ischemic attack, unspecified: Secondary | ICD-10-CM | POA: Diagnosis not present

## 2024-08-19 NOTE — Progress Notes (Signed)
 Remote PPM Transmission

## 2024-08-25 ENCOUNTER — Ambulatory Visit: Payer: Self-pay | Admitting: Cardiology

## 2024-09-05 DIAGNOSIS — I2510 Atherosclerotic heart disease of native coronary artery without angina pectoris: Secondary | ICD-10-CM | POA: Diagnosis not present

## 2024-09-05 DIAGNOSIS — I4821 Permanent atrial fibrillation: Secondary | ICD-10-CM | POA: Diagnosis not present

## 2024-09-05 DIAGNOSIS — J40 Bronchitis, not specified as acute or chronic: Secondary | ICD-10-CM | POA: Diagnosis not present

## 2024-09-05 DIAGNOSIS — E7800 Pure hypercholesterolemia, unspecified: Secondary | ICD-10-CM | POA: Diagnosis not present

## 2024-09-05 DIAGNOSIS — G459 Transient cerebral ischemic attack, unspecified: Secondary | ICD-10-CM | POA: Diagnosis not present

## 2024-09-05 DIAGNOSIS — I10 Essential (primary) hypertension: Secondary | ICD-10-CM | POA: Diagnosis not present

## 2024-09-08 DIAGNOSIS — L821 Other seborrheic keratosis: Secondary | ICD-10-CM | POA: Diagnosis not present

## 2024-09-08 DIAGNOSIS — D1801 Hemangioma of skin and subcutaneous tissue: Secondary | ICD-10-CM | POA: Diagnosis not present

## 2024-09-08 DIAGNOSIS — D485 Neoplasm of uncertain behavior of skin: Secondary | ICD-10-CM | POA: Diagnosis not present

## 2024-09-08 DIAGNOSIS — L814 Other melanin hyperpigmentation: Secondary | ICD-10-CM | POA: Diagnosis not present

## 2024-09-15 DIAGNOSIS — I10 Essential (primary) hypertension: Secondary | ICD-10-CM | POA: Diagnosis not present

## 2024-09-15 DIAGNOSIS — R972 Elevated prostate specific antigen [PSA]: Secondary | ICD-10-CM | POA: Diagnosis not present

## 2024-09-15 DIAGNOSIS — E7800 Pure hypercholesterolemia, unspecified: Secondary | ICD-10-CM | POA: Diagnosis not present

## 2024-11-15 ENCOUNTER — Ambulatory Visit

## 2025-02-14 ENCOUNTER — Ambulatory Visit

## 2025-05-16 ENCOUNTER — Ambulatory Visit
